# Patient Record
Sex: Male | Born: 1935 | Race: White | Hispanic: No | Marital: Married | Smoking: Never smoker
Health system: Southern US, Community
[De-identification: ages and names within clinical notes are randomized; demographics above are authoritative.]

## PROBLEM LIST (undated history)

## (undated) DIAGNOSIS — C259 Malignant neoplasm of pancreas, unspecified: Secondary | ICD-10-CM

## (undated) DIAGNOSIS — G4733 Obstructive sleep apnea (adult) (pediatric): Secondary | ICD-10-CM

## (undated) DIAGNOSIS — E039 Hypothyroidism, unspecified: Secondary | ICD-10-CM

## (undated) HISTORY — DX: Obstructive sleep apnea (adult) (pediatric): G47.33

## (undated) HISTORY — PX: PROSTATE SURGERY: SHX751

## (undated) HISTORY — DX: Malignant neoplasm of pancreas, unspecified: C25.9

## (undated) HISTORY — DX: Hypothyroidism, unspecified: E03.9

## (undated) HISTORY — PX: APPENDECTOMY: SHX54

---

## 1998-03-19 ENCOUNTER — Other Ambulatory Visit: Admission: RE | Admit: 1998-03-19 | Discharge: 1998-03-19 | Payer: Self-pay | Admitting: Gastroenterology

## 2001-08-03 ENCOUNTER — Ambulatory Visit (HOSPITAL_BASED_OUTPATIENT_CLINIC_OR_DEPARTMENT_OTHER): Admission: RE | Admit: 2001-08-03 | Discharge: 2001-08-03 | Payer: Self-pay | Admitting: Internal Medicine

## 2001-08-03 ENCOUNTER — Encounter: Payer: Self-pay | Admitting: Pulmonary Disease

## 2004-03-28 ENCOUNTER — Ambulatory Visit: Payer: Self-pay | Admitting: Internal Medicine

## 2004-04-07 ENCOUNTER — Ambulatory Visit: Payer: Self-pay | Admitting: Internal Medicine

## 2004-09-15 ENCOUNTER — Ambulatory Visit: Payer: Self-pay | Admitting: Internal Medicine

## 2004-09-19 ENCOUNTER — Ambulatory Visit: Payer: Self-pay | Admitting: Pulmonary Disease

## 2005-02-18 ENCOUNTER — Ambulatory Visit: Payer: Self-pay | Admitting: Internal Medicine

## 2006-02-25 ENCOUNTER — Ambulatory Visit: Payer: Self-pay | Admitting: Internal Medicine

## 2006-02-25 LAB — CONVERTED CEMR LAB: PSA: 6.52 ng/mL

## 2006-04-21 ENCOUNTER — Ambulatory Visit (HOSPITAL_BASED_OUTPATIENT_CLINIC_OR_DEPARTMENT_OTHER): Admission: RE | Admit: 2006-04-21 | Discharge: 2006-04-21 | Payer: Self-pay | Admitting: Urology

## 2006-04-23 ENCOUNTER — Emergency Department (HOSPITAL_COMMUNITY): Admission: EM | Admit: 2006-04-23 | Discharge: 2006-04-24 | Payer: Self-pay | Admitting: Emergency Medicine

## 2006-04-24 ENCOUNTER — Emergency Department (HOSPITAL_COMMUNITY): Admission: EM | Admit: 2006-04-24 | Discharge: 2006-04-24 | Payer: Self-pay | Admitting: Emergency Medicine

## 2006-10-11 ENCOUNTER — Encounter (INDEPENDENT_AMBULATORY_CARE_PROVIDER_SITE_OTHER): Payer: Self-pay | Admitting: Urology

## 2006-10-11 ENCOUNTER — Inpatient Hospital Stay (HOSPITAL_COMMUNITY): Admission: RE | Admit: 2006-10-11 | Discharge: 2006-10-15 | Payer: Self-pay | Admitting: Urology

## 2007-02-03 ENCOUNTER — Encounter: Payer: Self-pay | Admitting: Internal Medicine

## 2007-02-03 DIAGNOSIS — K219 Gastro-esophageal reflux disease without esophagitis: Secondary | ICD-10-CM | POA: Insufficient documentation

## 2007-02-03 DIAGNOSIS — E039 Hypothyroidism, unspecified: Secondary | ICD-10-CM | POA: Insufficient documentation

## 2007-02-03 DIAGNOSIS — Z860100 Personal history of colon polyps, unspecified: Secondary | ICD-10-CM | POA: Insufficient documentation

## 2007-02-03 DIAGNOSIS — N400 Enlarged prostate without lower urinary tract symptoms: Secondary | ICD-10-CM

## 2007-02-03 DIAGNOSIS — E785 Hyperlipidemia, unspecified: Secondary | ICD-10-CM

## 2007-02-03 DIAGNOSIS — G4733 Obstructive sleep apnea (adult) (pediatric): Secondary | ICD-10-CM

## 2007-02-04 ENCOUNTER — Ambulatory Visit: Payer: Self-pay | Admitting: Internal Medicine

## 2007-02-04 LAB — CONVERTED CEMR LAB
ALT: 26 units/L (ref 0–53)
AST: 27 units/L (ref 0–37)
Albumin: 4.1 g/dL (ref 3.5–5.2)
Alkaline Phosphatase: 46 units/L (ref 39–117)
Basophils Absolute: 0 10*3/uL (ref 0.0–0.1)
Bilirubin Urine: NEGATIVE
CO2: 28 meq/L (ref 19–32)
Calcium: 9.4 mg/dL (ref 8.4–10.5)
Chloride: 108 meq/L (ref 96–112)
Cholesterol: 206 mg/dL (ref 0–200)
Creatinine, Ser: 0.8 mg/dL (ref 0.4–1.5)
Eosinophils Relative: 3.5 % (ref 0.0–5.0)
Glucose, Bld: 107 mg/dL — ABNORMAL HIGH (ref 70–99)
HCT: 43.5 % (ref 39.0–52.0)
Hemoglobin, Urine: NEGATIVE
Ketones, ur: NEGATIVE mg/dL
Monocytes Relative: 10 % (ref 3.0–11.0)
Neutrophils Relative %: 51 % (ref 43.0–77.0)
RDW: 13.9 % (ref 11.5–14.6)
Sodium: 142 meq/L (ref 135–145)
Specific Gravity, Urine: 1.025 (ref 1.000–1.03)
Total Bilirubin: 1 mg/dL (ref 0.3–1.2)
Total Protein: 7 g/dL (ref 6.0–8.3)
Urine Glucose: NEGATIVE mg/dL
VLDL: 29 mg/dL (ref 0–40)
pH: 5.5 (ref 5.0–8.0)

## 2007-02-24 ENCOUNTER — Ambulatory Visit: Payer: Self-pay | Admitting: Pulmonary Disease

## 2007-03-31 ENCOUNTER — Ambulatory Visit: Payer: Self-pay | Admitting: Internal Medicine

## 2007-03-31 DIAGNOSIS — M25569 Pain in unspecified knee: Secondary | ICD-10-CM

## 2007-03-31 DIAGNOSIS — E739 Lactose intolerance, unspecified: Secondary | ICD-10-CM | POA: Insufficient documentation

## 2007-03-31 DIAGNOSIS — J309 Allergic rhinitis, unspecified: Secondary | ICD-10-CM | POA: Insufficient documentation

## 2007-08-16 ENCOUNTER — Ambulatory Visit: Payer: Self-pay | Admitting: Internal Medicine

## 2007-12-23 ENCOUNTER — Telehealth: Payer: Self-pay | Admitting: Internal Medicine

## 2008-02-17 ENCOUNTER — Ambulatory Visit: Payer: Self-pay | Admitting: Internal Medicine

## 2008-02-17 LAB — CONVERTED CEMR LAB
ALT: 24 units/L (ref 0–53)
Albumin: 4.2 g/dL (ref 3.5–5.2)
Alkaline Phosphatase: 48 units/L (ref 39–117)
Basophils Relative: 0 % (ref 0.0–3.0)
Cholesterol: 158 mg/dL (ref 0–200)
Creatinine, Ser: 0.8 mg/dL (ref 0.4–1.5)
Eosinophils Absolute: 0.4 10*3/uL (ref 0.0–0.7)
GFR calc Af Amer: 122 mL/min
HCT: 44.9 % (ref 39.0–52.0)
Ketones, ur: NEGATIVE mg/dL
LDL Cholesterol: 88 mg/dL (ref 0–99)
Lymphocytes Relative: 32.4 % (ref 12.0–46.0)
MCV: 93.2 fL (ref 78.0–100.0)
Monocytes Absolute: 0.5 10*3/uL (ref 0.1–1.0)
Monocytes Relative: 8 % (ref 3.0–12.0)
Neutro Abs: 3.6 10*3/uL (ref 1.4–7.7)
Neutrophils Relative %: 54 % (ref 43.0–77.0)
PSA: 0.65 ng/mL (ref 0.10–4.00)
Platelets: 231 10*3/uL (ref 150–400)
Potassium: 4 meq/L (ref 3.5–5.1)
RDW: 12.3 % (ref 11.5–14.6)
Sodium: 140 meq/L (ref 135–145)
Specific Gravity, Urine: 1.025 (ref 1.000–1.03)
TSH: 4.45 microintl units/mL (ref 0.35–5.50)
Total Bilirubin: 1.1 mg/dL (ref 0.3–1.2)
Total CHOL/HDL Ratio: 4.2
Total Protein, Urine: NEGATIVE mg/dL
Triglycerides: 163 mg/dL — ABNORMAL HIGH (ref 0–149)
Urine Glucose: NEGATIVE mg/dL
Urobilinogen, UA: 0.2 (ref 0.0–1.0)

## 2008-08-07 ENCOUNTER — Telehealth (INDEPENDENT_AMBULATORY_CARE_PROVIDER_SITE_OTHER): Payer: Self-pay | Admitting: *Deleted

## 2008-08-09 ENCOUNTER — Ambulatory Visit: Payer: Self-pay | Admitting: Pulmonary Disease

## 2008-08-09 ENCOUNTER — Telehealth: Payer: Self-pay | Admitting: Internal Medicine

## 2008-08-12 ENCOUNTER — Encounter: Payer: Self-pay | Admitting: Internal Medicine

## 2008-08-13 ENCOUNTER — Ambulatory Visit: Payer: Self-pay | Admitting: Internal Medicine

## 2009-05-27 ENCOUNTER — Encounter (INDEPENDENT_AMBULATORY_CARE_PROVIDER_SITE_OTHER): Payer: Self-pay | Admitting: *Deleted

## 2009-06-12 ENCOUNTER — Encounter (INDEPENDENT_AMBULATORY_CARE_PROVIDER_SITE_OTHER): Payer: Self-pay | Admitting: *Deleted

## 2009-06-13 ENCOUNTER — Ambulatory Visit: Payer: Self-pay | Admitting: Gastroenterology

## 2009-06-21 ENCOUNTER — Ambulatory Visit: Payer: Self-pay | Admitting: Gastroenterology

## 2009-11-04 ENCOUNTER — Encounter: Payer: Self-pay | Admitting: Pulmonary Disease

## 2010-06-12 NOTE — Miscellaneous (Signed)
Summary: Moviprep rx  Clinical Lists Changes  Medications: Added new medication of MOVIPREP 100 GM  SOLR (PEG-KCL-NACL-NASULF-NA ASC-C) As per prep instructions. - Signed Rx of MOVIPREP 100 GM  SOLR (PEG-KCL-NACL-NASULF-NA ASC-C) As per prep instructions.;  #1 x 0;  Signed;  Entered by: Jennye Boroughs RN;  Authorized by: Louis Meckel MD;  Method used: [facility] to [facility] [address] [phone]*, [address] [place], [place], [place]  [address], Ph: [phone] or [phone], Fax: [phone]    Prescriptions: MOVIPREP 100 GM  SOLR (PEG-KCL-NACL-NASULF-NA ASC-C) As per prep instructions.  #1 x 0   Entered by:   Jennye Boroughs RN   Authorized by:   Louis Meckel MD   Signed by:   Jennye Boroughs RN on 06/13/2009   Method used:   [facility]        [facility] [address] [phone]* (retail)       [address]       [place], [place]  [address]       Ph: [phone] or [phone]       Fax: [phone]   RxID:   [id number]

## 2010-06-12 NOTE — Letter (Signed)
Summary: Denied CMN/[facility]  Denied CMN/[facility]   Imported By: Lester [place] 11/12/2009 08:25:19  _____________________________________________________________________  External Attachment:    Type:   Image     Comment:   External Document

## 2010-06-12 NOTE — Procedures (Signed)
Summary: Colonoscopy  Patient: [name] Note: All result statuses are Final unless otherwise noted.  Tests: (1) Colonoscopy (COL)   COL Colonoscopy           DONE     [facility]     [address]     [place], [place]  [address]           COLONOSCOPY PROCEDURE REPORT           PATIENT:  Seth King, Seth King  MR#:  [id number]     BIRTHDATE:  [birth year], 77 yrs. old  GENDER:  male           ENDOSCOPIST:  Barbette Hair. Arlyce Dice, MD     Referred by:           PROCEDURE DATE:  06/21/2009     PROCEDURE:  Colonoscopy, Diagnostic     ASA CLASS:  Class II     INDICATIONS:  history of pre-cancerous (adenomatous) colon polyps     , 1999           MEDICATIONS:   Fentanyl 75 mcg IV, Versed 8 mg IV           DESCRIPTION OF PROCEDURE:   After the risks benefits and     alternatives of the procedure were thoroughly explained, informed     consent was obtained.  Digital rectal exam was performed and     revealed no abnormalities.   The LB CF-[id number] endoscope     was introduced through the anus and advanced to the cecum, which     was identified by both the appendix and ileocecal valve, without     limitations.  The quality of the prep was excellent, using     MoviPrep.  The instrument was then slowly withdrawn as the colon     was fully examined.     <<PROCEDUREIMAGES>>           FINDINGS:  Moderate diverticulosis was found in the sigmoid colon     (see image1).  This was otherwise a normal examination of the     colon (see image4, image5, image7, image8, image9, image10,     image13, image14, and image15).   Retroflexed views in the rectum     revealed no abnormalities.    The scope was then withdrawn from     the patient and the procedure completed.           COMPLICATIONS:  None           ENDOSCOPIC IMPRESSION:     1) Moderate diverticulosis in the sigmoid colon     2) Otherwise normal examination     RECOMMENDATIONS:     1) follow-up: office PRN           REPEAT  EXAM:  No           ______________________________     Barbette Hair. Arlyce Dice, MD           CC:  Corwin Levins, MD           n.     Rosalie Doctor:   Barbette Hair. Sharaya Boruff at 06/21/2009 11:24 AM           [place], [place], [phone]  Note: An exclamation mark (!) indicates a result that was not dispersed into the flowsheet. Document Creation Date: 06/21/2009 11:24 AM _______________________________________________________________________  (1) Order result status: Final Collection or observation date-time: 06/21/2009 11:17 Requested date-time:  Receipt date-time:  Reported  date-time:  Referring Physician:   Ordering Physician: Melvia Heaps [phone]) Specimen Source:  Source: Launa Grill Order Number: [id number] Lab site:

## 2010-06-12 NOTE — Letter (Signed)
Summary: Previsit letter  [facility]  [address] [place], [place] [address]   Phone: [phone]  Fax: [phone]       05/27/2009 MRN: [id number]  Seth King [address] [place], [place]  [address]  Dear Seth King,  Welcome to the Gastroenterology Division at [facility].    You are scheduled to see a nurse for your pre-procedure visit on 06-13-09 at 10:30am on the 3rd floor at [facility], [address]  We ask that you try to arrive at our office 15 minutes prior to your appointment time to allow for check-in.  Your nurse visit will consist of discussing your medical and surgical history, your immediate family medical history, and your medications.    Please bring a complete list of all your medications or, if you prefer, bring the medication bottles and we will list them.  We will need to be aware of both prescribed and over the counter drugs.  We will need to know exact dosage information as well.  If you are on blood thinners (Coumadin, Plavix, Aggrenox, Ticlid, etc.) please call our office today/prior to your appointment, as we need to consult with your physician about holding your medication.   Please be prepared to read and sign documents such as consent forms, a financial agreement, and acknowledgement forms.  If necessary, and with your consent, a friend or relative is welcome to sit-in on the nurse visit with you.  Please bring your insurance card so that we may make a copy of it.  If your insurance requires a referral to see a specialist, please bring your referral form from your primary care physician.  No co-pay is required for this nurse visit.     If you cannot keep your appointment, please call [phone] to cancel or reschedule prior to your appointment date.  This allows [place] the opportunity to schedule an appointment for another patient in need of care.    Thank you for choosing [facility] for your medical  needs.  We appreciate the opportunity to care for you.  Please visit [place] at our website  to learn more about our practice.                     Sincerely.                                                                                                                   The Gastroenterology Division

## 2010-06-12 NOTE — Letter (Signed)
Summary: Seth King Instructions  Seth King  [address] [place], [place] [address]   Phone: [phone]  Fax: [phone]       Seth King    03/09/1932    MRN: [id number]        Procedure Day Seth King:  Seth King  06/21/09     Arrival Time:  9:30AM     Procedure Time:  10:30AM     Location of Procedure:                    Seth King _  Seth King (4th Floor)                        PREPARATION FOR COLONOSCOPY WITH MOVIPREP   Starting 5 days prior to your procedure 06/16/09 do not eat nuts, seeds, popcorn, corn, beans, peas,  salads, or any raw vegetables.  Do not take any fiber supplements (e.g. Metamucil, Citrucel, and Benefiber).  THE DAY BEFORE YOUR PROCEDURE         DATE: 06/20/09 DAY: THURSDAY  1.  Drink clear liquids the entire day-NO SOLID FOOD  2.  Do not drink anything colored red or purple.  Avoid juices with pulp.  No orange juice.  3.  Drink at least 64 oz. (8 glasses) of fluid/clear liquids during the day to prevent dehydration and help the prep work efficiently.  CLEAR LIQUIDS INCLUDE: Water Jello Ice Popsicles Tea (sugar ok, no milk/cream) Powdered fruit flavored drinks Coffee (sugar ok, no milk/cream) Gatorade Juice: apple, white grape, white cranberry  Lemonade Clear bullion, consomm, broth Carbonated beverages (any kind) Strained chicken noodle soup Hard Candy                             4.  In the morning, mix first dose of MoviPrep solution:    Empty 1 Pouch A and 1 Pouch B into the disposable container    Add lukewarm drinking water to the top line of the container. Mix to dissolve    Refrigerate (mixed solution should be used within 24 hrs)  5.  Begin drinking the prep at 5:00 p.m. The MoviPrep container is divided by 4 marks.   Every 15 minutes drink the solution down to the next mark (approximately 8 oz) until the full liter is complete.   6.  Follow completed prep with 16 oz of clear liquid of your choice  (Nothing red or purple).  Continue to drink clear liquids until bedtime.  7.  Before going to bed, mix second dose of MoviPrep solution:    Empty 1 Pouch A and 1 Pouch B into the disposable container    Add lukewarm drinking water to the top line of the container. Mix to dissolve    Refrigerate  THE DAY OF YOUR PROCEDURE      DATE: 06/21/09 DAY: FRIDAY  Beginning at 5:30AM (5 hours before procedure):         1. Every 15 minutes, drink the solution down to the next mark (approx 8 oz) until the full liter is complete.  2. Follow completed prep with 16 oz. of clear liquid of your choice.    3. You may drink clear liquids until 8:30AM (2 HOURS BEFORE PROCEDURE).   MEDICATION INSTRUCTIONS  Unless otherwise instructed, you should take regular prescription medications with a small sip of water   as early as possible the morning of your  procedure.           OTHER INSTRUCTIONS  You will need a responsible adult at least 75 years of age to accompany you and drive you home.   This person must remain in the waiting room during your procedure.  Wear loose fitting clothing that is easily removed.  Leave jewelry and other valuables at home.  However, you may wish to bring a book to read or  an iPod/MP3 player to listen to music as you wait for your procedure to start.  Remove all body piercing jewelry and leave at home.  Total time from sign-in until discharge is approximately 2-3 hours.  You should go home directly after your procedure and rest.  You can resume normal activities the  day after your procedure.    The day of your procedure you should not:   Drive   Make legal decisions   Operate machinery   Drink alcohol   Return to work  You will receive specific instructions about eating, activities and medications before you leave.    The above instructions have been reviewed and explained to me by  Seth Boroughs RN  June 13, 2009 10:52 AM     I fully  understand and can verbalize these instructions _____________________________ Date _________

## 2010-09-23 NOTE — Op Note (Signed)
NAME:  Seth King, Seth King NO.:  [id number]   MEDICAL RECORD NO.:  [id number]          PATIENT TYPE:  INP   LOCATION:  1433                         FACILITY[facility]  [facility]   PHYSICIAN:  Mark C. Vernie Ammons, M.D.  DATE OF BIRTH:  October 22, 1931   DATE OF PROCEDURE:  10/11/2006  DATE OF DISCHARGE:                               OPERATIVE REPORT   PREOPERATIVE DIAGNOSIS:  Benign prostatic hypertrophy.   POSTOPERATIVE DIAGNOSES:  1. Benign prostatic hypertrophy.  2. Bladder stone.   PROCEDURES:  1. Open simple suprapubic prostatectomy.  2. Removal of bladder stone.   SURGEON:  Mark C. Vernie Ammons, M.D.   RESIDENT:  Terie Purser, MD.   ANESTHESIA:  General.   COMPLICATIONS:  None.   ESTIMATED BLOOD LOSS:  550 mL.   DRAINS:  22-French Foley catheter, 18-French Foley catheter as a  suprapubic tube, JP drain to bulb suction.   SPECIMENS:  1. Prostate.  2. Bladder stones.  3. Urine culture.   DISPOSITION:  Stable to post anesthesia care unit.   INDICATIONS FOR PROCEDURE:  The patient is a 77 year old male who has a  history of BPH.  He has been on medical therapy, but has failed.  He has  been in urinary retention and has had a suprapubic tube placed.  He has  a large prostate as evidenced on transrectal ultrasound and evidence of  a bladder stone.  He was counseled regarding management options and has  elected to proceed with open simple prostatectomy.  Benefits and risks  procedure were explained, consent was obtained.   DESCRIPTION OF PROCEDURE:  The patient was brought to the operating room  and properly identified.  Time-out was performed to confirm correct  patient and procedure.  He was administered general anesthesia, given  preoperative antibiotic and placed in supine position on the operating  table and prepped and draped in sterile fashion.  We first took a urine  culture from his suprapubic tube.  The suprapubic tube was then removed  and the patient was prepped  and draped.  We then made a lower midline  incision from the level of the umbilicus to the pubic bone.  This  incorporated the suprapubic tube tract.  Dissection was carried down  through the subcutaneous tissues to the fascia.  Fascia was divided in  the midline.  The suprapubic tube tract was excised.  We filled the  bladder to facilitate identification.  The patient did have extensive  adhesions in the retropubic space secondary to multiple suprapubic  tubes.  We carefully dissected these areas off and placed a Bookwalter  retractor.  We then proceeded to incise the bladder in the midline.  Stay sutures of 2-0 Vicryl were placed.  The bladder was opened and  exposed.  The dome was packed superiorly with sponges and held up with a  malleable retractor.  At this point, we were able to visualize the large  median lobe.  The bladder was also quite thick and due to his previous  history.  There were several stones noted in the base of the bladder and  these were  removed.  Indigo carmine was given to facilitate  identification of the ureteral orifices.  Both were noted to be  effluxing clear urine and their location was noted.  We then began by  scoring the mucosa around the bladder neck to gain access to the  prostate.  Gentle blunt dissection was used to develop a plane between  the adenoma and the capsule of the prostate.  Digital dissection was  used to free the posterior lobes down to the apex of the prostate and  then circumferentially sweeping anteriorly.  We were able to remove the  adenoma in one piece.  Once this was performed, the prostatic fossa was  inspected.  Digital sweep was again performed and there was no  significant remaining adenoma.  There was some bleeding coming from the  5 and 7 o'clock positions.  This was controlled using figure-of-eight 2-  0 Vicryl suture after packing the prostatic fossa with sponges.  After  this was performed, the fossa was inspected and  hemostasis was adequate.  At this point, a 22-French Foley catheter was placed through the  urethra.  The balloon was inflated with 60 mL of sterile water.  An 12-  [place] Foley catheter was also placed through a separate stab incision  to the left of the midline.  This was brought into the bladder by  performing a separate cystotomy over a right-angle.  This balloon was  inflated with 5 mL.  The catheter was secured to the bladder using a  pursestring 3-0 chromic suture.  We then proceeded with closure of the  cystotomy.  This was performed in two layers using a 2-0 Vicryl suture  with a watertight mucosal and muscle layer followed by an imbricating  layer.  The bladder was then irrigated through both catheters.  There  was no leak noted.  Both catheters irrigated freely without return of  clot.  It should be noted that prior to closure of the bladder, both  ureteral orifices were also reinspected and both were effluxing clear  blue urine bilaterally.  We then placed a Jackson-Pratt drain through a  separate stab incision in the right lower quadrant.  The drain was  placed in the retropubic space overlying the bladder.  Wound was then  carefully irrigated.  The fascia was closed in a running fashion with #1  PDS.  Again the wound was carefully irrigated and skin was closed with  staples.  Both the suprapubic tube and the Jackson-Pratt drain were  affixed with silk suture at the skin.  A sterile dressing was applied.  The Foley catheter was placed on slight traction and continuous bladder  irrigation was started with return of light pink urine.  The patient was  then awoken from anesthesia and transported to the recovery room in  stable condition.  There were no complications.  All sponge and needle  counts were correct at the end of the case.  Please note that Dr.  Vernie Ammons was present and participated in all aspects of this procedure as  he is primary  Careers adviser.    ______________________________  Terie Purser, MD      Veverly Fells. Vernie Ammons, M.D.  Electronically Signed    JH/MEDQ  D:  10/11/2006  T:  10/12/2006  Job:  [id number]

## 2010-09-23 NOTE — Discharge Summary (Signed)
NAME:  Seth King, Seth King NO.:  [id number]   MEDICAL RECORD NO.:  [id number]          PATIENT TYPE:  INP   LOCATION:  1433                         FACILITY:  [facility]   PHYSICIAN:  Mark C. Vernie Ammons, M.D.  DATE OF BIRTH:  24-Feb-1932   DATE OF ADMISSION:  10/11/2006  DATE OF DISCHARGE:  10/15/2006                               DISCHARGE SUMMARY   ADMISSION DIAGNOSES:  1. Benign prostatic hypertrophy.  2. Bladder outlet obstruction.  3. Urinary retention.   DISCHARGE DIAGNOSES:  1. Benign prostatic hypertrophy.  2. Bladder outlet obstruction.  3. Urinary retention.   PROCEDURE:  Open simple prostatectomy on October 11, 2006.   HISTORY OF PRESENT ILLNESS:  For full details, please see dictated  history and physical.  Briefly, Seth King is a 77 year old patient  who has a longstanding history of BPH with bladder outlet obstruction.  He has been placed on medical therapy but has developed urinary  retention and is currently managed with a suprapubic tube.  He has  failed multiple voiding trials.  Because that he has a significantly  enlarged prostate, he is brought to the hospital for a suprapubic  prostatectomy.   HOSPITAL COURSE:  The patient was brought to the hospital and taken to  the operating room for the procedure described above.  He tolerated this  well and was transferred to the floor in stable condition.  The patient  had continuous bladder irrigation maintained through postoperative day  #2.  His diet was slowly advanced.  He was transitioned from IV to p.o.  pain medicine.  He was ambulating without difficulty.  His suprapubic  tube was removed on postoperative day #3, and his JP drain was removed  on postoperative day #4.  He was therefore deemed stable for discharge  on the morning of postoperative day #4.   PHYSICAL EXAMINATION:  VITAL SIGNS:  At the time of discharge, the  patient was afebrile with stable vital signs.  GENERAL:  He was in no acute  distress.  HEART:  Regular.  LUNGS:  Clear.  ABDOMEN:  Soft, nontender.  Surgical incision was clean, dry, intact  with staples in place.  He had a Foley catheter in place draining clear  urine.   DISPOSITION AND DISCHARGE INSTRUCTIONS:  The patient is discharged to  home in stable and improved condition.  He is to resume his previous  diet.  Activity level is restricted to no heavy lifting.  He is  instructed not to drive a car or do any strenuous exercise.  He was also  instructed to call with any questions or concerns regarding care,  specifically any fevers greater than 100.5, any increasing abdominal  pain, nausea, vomiting or increasing redness or drainage from his  incision, and also any difficulty with his Foley catheter.   DISCHARGE MEDICATIONS:  1. Synthroid 88 mcg daily.  2. Lovastatin 20 mg daily.  3. Multivitamin daily.  4. Citrucel daily.  5. The patient should stop his Flomax and Avodart.  6. He was also given a prescription for Vicodin as needed for pain and  Cipro for recent urinary tract infection.   FOLLOW UP:  The patient will follow up with Dr. Vernie Ammons in approximately  1 week for catheter removal.     ______________________________  Terie Purser, MD      Veverly Fells. Vernie Ammons, M.D.  Electronically Signed    JH/MEDQ  D:  10/15/2006  T:  10/15/2006  Job:  [id number]

## 2010-09-23 NOTE — H&P (Signed)
NAME:  Seth King, Seth King NO.:  [id number]   MEDICAL RECORD NO.:  [id number]          PATIENT TYPE:  INP   LOCATION:  0006                         FACILITY:  [facility]   PHYSICIAN:  Mark C. Vernie Ammons, M.D.  DATE OF BIRTH:  [birth year]   DATE OF ADMISSION:  10/11/2006  DATE OF DISCHARGE:                              HISTORY & PHYSICAL   HISTORY:  Seth King is a 77 year old white male patient that I have  seen for a long time with BPH and bladder outlet obstructive symptoms.  He has had a prior transrectal ultrasound and biopsy of his prostate  because of an elevated PSA back in December 2003.  At that time his  prostate was 126 mL.  He had been doing well as far as voiding goes with  alpha blockade therapy; however, developed urinary retention.  A  suprapubic tube was placed and he was placed on maximum medical  management using both Avodart and Flomax.  Despite this, he continued to  have urinary retention with several voiding trials attempted and failed.  We discussed all the treatment options and he has elected to proceed  with suprapubic prostatectomy.  He is brought to the hospital today for  this elective procedure.   PAST MEDICAL HISTORY:  Positive for hypothyroidism, sleep apnea and  elevated cholesterol.   SURGICAL HISTORY:  He has had an appendectomy.   MEDICATIONS:  1. Levothyroxine 88 mcg.  2. Flomax 0.4 mg.  3. Lovastatin 20 mg.  4. Avodart 0.5 mg.  5. Multivitamin.  6. Citrucel.   ALLERGIES:  PENICILLIN caused hives.   SOCIAL HISTORY:  He is married, retired, does drink alcohol two to three  times a week, and denies tobacco use.   FAMILY HISTORY:  Mother died at 18, father at 32 years of age, and there  is diabetes in the family.   REVIEW OF SYSTEMS:  Reveals occasional fatigue and sinus problems,  dizziness, otherwise is completely negative.   PHYSICAL EXAMINATION:  VITAL SIGNS:  Temperature 96.6, pulse 64,  respiration 18, blood pressure  153/77.  GENERAL:  The patient is a well-developed, well-nourished white male in  no apparent distress.  HEENT:  Atraumatic, normocephalic.  Oropharynx is clear.  NECK:  Supple with midline trachea.  CHEST:  Reveals normal respiratory effort.  CARDIOVASCULAR:  Regular rate and rhythm.  ABDOMEN:  Obese, soft and nontender without mass or HSM.  He has a  suprapubic tube exiting the suprapubic region draining clear urine with  no sign of site infection.  GENITOURINARY:  He has normal phallus, glans, meatus, scrotum,  testicles, epididymis, anus and perineum.  Normal rectal tone.  His  prostate is 3+ enlarged, smooth, symmetric and has no suspicious  nodularity or induration.  No seminal vesicle abnormalities noted.  SKIN:  Warm and dry.  EXTREMITIES:  Without clubbing, cyanosis, or edema.  NEUROLOGIC:  He is alert and oriented with appropriate mood and affect,  has no gross focal neurologic deficit.   LABORATORY RESULTS:  His PSA in August 2007 was 7.0 but the ratio was  30.7%.  His PSA has  been as high as 17 back in June 2006, indicating  likely prostatitis.  When he was biopsied in 200e his PSA was 7.32.  A  urine culture was done on Oct 05, 2006, and was positive for what  appeared to be Pseudomonas.  It was sensitive to the fluoroquinolones as  well as ceftriaxone.  He has been on Cipro 500 mg b.i.d. since May 29.  His white count is 7.9; hemoglobin 15.8; hematocrit 45.0; platelets  228,000.  Electrolytes are normal with a creatinine of 0.82.  Liver  function tests are normal.  PT 13.2, INR 1.0, PTT 27.   Chest x-ray revealed no evidence of infiltrate, congestive failure or  pneumothorax.  There are slight increased markings at the costophrenic  angle, possibly representing scarring or atelectasis.   EKG reveals normal sinus rhythm with some sinus arrhythmia, no ST- or T-  wave changes noted.   IMPRESSION:  1. Benign prostatic hypertrophy with bladder outlet obstruction and       resultant urinary retention.  I have attempted conservative medical      management for several months now without success.  The patient is      therefore brought to the operating room for surgical treatment.  I      have gone over the procedure with him as well as its risks and      complications and alternatives.  2. He has history of prostatitis.  3. History of elevated PSA with negative biopsy in December 2003.  4. Recent urinary tract infection.  He is currently on antibiotics for      that.  5. Sleep apnea.  He uses a CPAP mask at night with a setting of 10.  6. Hypothyroidism.  7. Hypercholesterolemia.   PLAN:  1. He has been on oral Cipro.  I am also going to give him 1 g of      Rocephin preoperatively.  2. Routine DVT prophylaxis with PAS and TED hose.  This type of      surgery has a high probability of postoperative bleeding      complications.  He therefore will not be placed on heparin or      Lovenox postoperatively.  3. He will use his CPAP mask and I will monitor his oxygen saturation      through his hospitalization.  4. Proceed with the suprapubic prostatectomy under general anesthesia.      Mark C. Vernie Ammons, M.D.  Electronically Signed     MCO/MEDQ  D:  10/11/2006  T:  10/11/2006  Job:  [id number]

## 2010-09-26 NOTE — Op Note (Signed)
NAME:  Seth King, Seth King NO.:  [id number]   MEDICAL RECORD NO.:  [id number]          PATIENT TYPE:  AMB   LOCATION:  NESC                         FACILITY:  [facility]   PHYSICIAN:  Mark C. Vernie Ammons, M.D.  DATE OF BIRTH:  1932-02-22   DATE OF PROCEDURE:  DATE OF DISCHARGE:                               OPERATIVE REPORT   PREOPERATIVE DIAGNOSIS:  Urinary retention.   POSTOPERATIVE DIAGNOSIS:  Urinary retention.   PROCEDURE:  Cystoscopy with punch suprapubic tube placement.   SURGEON:  Dr. Vernie Ammons   ANESTHESIA:  General with local supplement.   SPECIMENS:  None.   BLOOD LOSS:  Minimal.   DRAINS:  14 French Fader tip suprapubic tube.   COMPLICATIONS:  None.   INDICATIONS:  The patient is a 77 year old white male with a markedly  enlarged prostate who has had outlet obstructive symptoms for some time.  He was managed with Flomax and by previous ultrasound in 2003, was found  to have a 126 g prostate.  He went into urinary retention and has been  on Flomax and Avodart without improvement of his voiding.  He continues  to fail voiding trials, and we have discussed surgery which would  require suprapubic prostatectomy based on his previous prostate size.  My hope was that I could shrink his prostate and hopefully get him  voiding spontaneously but it on Avodart; however, if he does not  eventually void spontaneously, then if his prostate has decreased in  size significantly, consideration for TURP could be given.   DESCRIPTION OF OPERATION:  After informed consent, the patient brought  to major OR, placed on table, administered general anesthesia.  He had  been placed on Cipro 500 mg b.i.d. for 4 days prior to the procedure.  He also received intravenous Cipro 400 mg in the holding area.  He was  administered general LMA anesthesia and then moved to the dorsal  lithotomy position.  His genitalia was sterilely prepped and draped.  A  22-French cystoscope with  12-degree lens was inserted.  The urethra is  noted to be normal down to the sphincter which appears intact.  He has  trilobar hypertrophy with some irritation of the prostatic urethra from  the Foley catheter but no worrisome lesions.  The bladder was then  entered and noted to have 2+ trabeculation.  There were no tumor,  stones, or worrisome lesions.  There was some irritation on the  posterior bladder wall from the Foley catheter.   The patient was placed in Trendelenburg position.  The bladder was then  filled through the cystoscope with 70-degree lens in place and  approximately 2-3 fingerbreadths above the symphysis pubis.  Marcaine  0.5% with epinephrine was then injected into the skin and subcu tissue.  I then used a spinal needle to find the bladder which was seen to enter  the bladder at the dome cystoscopically.  I then removed this and made a  small stab incision and passed the Fader tip suprapubic tube through the  stab incision into the bladder at its dome.  The trocar was removed from  the suprapubic tube, and curl was applied to the tip.  It was also  secured to the skin with a single 2-0 silk suture.  This was connected  to closed system drainage; the cystoscope was removed, and the patient  was awakened and taken to recovery room in stable satisfactory  condition.  Tolerated procedure well.  There were no intraoperative  complications.  I did perform DRE and found the prostate to be smooth  and symmetric but 2-3+ enlarged.   He was given written instructions on suprapubic care and how to perform  voiding trials with the suprapubic tube.  He will also remain on Cipro  500 mg b.i.d. for another 5 days, and I gave him 24 Vicodin for pain.  He will follow up in my office in 4 weeks for suprapubic tube change at  that time.      Mark C. Vernie Ammons, M.D.  Electronically Signed     MCO/MEDQ  D:  04/21/2006  T:  04/21/2006  Job:  [id number]

## 2011-02-26 LAB — URINE CULTURE
Colony Count: 60000
Special Requests: NEGATIVE

## 2011-02-26 LAB — CBC
HCT: 38.6 — ABNORMAL LOW
Hemoglobin: 13.3
MCHC: 34.5
MCV: 89.7
Platelets: 226
RBC: 4.3
RDW: 14.3 — ABNORMAL HIGH
WBC: 11.6 — ABNORMAL HIGH

## 2011-02-26 LAB — ABO/RH: ABO/RH(D): A POS

## 2011-02-26 LAB — BASIC METABOLIC PANEL
BUN: 8
CO2: 24
Calcium: 8.2 — ABNORMAL LOW
Chloride: 104
Creatinine, Ser: 0.82
GFR calc Af Amer: >60
GFR calc non Af Amer: >60
Glucose, Bld: 128 — ABNORMAL HIGH
Potassium: 4.2
Sodium: 136

## 2011-02-26 LAB — TYPE AND SCREEN
ABO/RH(D): A POS
Antibody Screen: NEGATIVE

## 2011-02-26 LAB — HEMOGLOBIN AND HEMATOCRIT, BLOOD
HCT: 41.9
Hemoglobin: 14.3

## 2013-08-07 ENCOUNTER — Telehealth: Payer: Self-pay | Admitting: Pulmonary Disease

## 2013-08-07 ENCOUNTER — Ambulatory Visit (INDEPENDENT_AMBULATORY_CARE_PROVIDER_SITE_OTHER): Payer: Medicare Other | Admitting: Pulmonary Disease

## 2013-08-07 ENCOUNTER — Encounter: Payer: Self-pay | Admitting: *Deleted

## 2013-08-07 VITALS — BP 122/76 | HR 74 | Temp 97.7°F | Ht 71.0 in | Wt 198.8 lb

## 2013-08-07 DIAGNOSIS — G4733 Obstructive sleep apnea (adult) (pediatric): Secondary | ICD-10-CM

## 2013-08-07 NOTE — Assessment & Plan Note (Addendum)
We will set you up with CPAP  Your machine will be set at 10 cm God luck with surgery  Take your machine with you & let your surgeon/ anesthesiologist know He has lost weight since his original study, and we will perhaps repeat a polysomnogram in the future once his acute issues are resolved   compliance with goal of at least 4-6 hrs every night is the expectation. Advised against medications with sedative side effects Cautioned against driving when sleepy - understanding that sleepiness will vary on a day to day basis

## 2013-08-07 NOTE — Patient Instructions (Signed)
We will set you up with CPAP  Your machine will be set at 10 cm God luck with surgery  Take your machine with you & let your surgeon/ anesthesiologist know

## 2013-08-07 NOTE — Progress Notes (Signed)
   Subjective:    Patient ID: Seth King, male    DOB: 05/14/1931, 80 y.o.   MRN: [id number]  HPI  80 year old man presents for management of obstructive sleep apnea. Polysomnogram in 07/2001 - weight 215 pounds-showed severe OSA with RDI of 76 events per hour corrected by CPAP of 10 cm. He changed his CPAP in 2010, and was very compliant with this. His machine however broke down a few weeks ago when he was in [place]. 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 Unfortunately he has been diagnosed with what seems like: cholangio-carcinoma, and whipple'ssurgery is planned at [facility].  Epworth sleepiness score is 4/24. Bedtime is around 10:30 PM, sleep latency is about 20 minutes, he sleeps on his side with one pillow, has 2-3 awakenings for nocturia and is out of bed at 7 AM without headache or fatigue. He does report mild dryness of mouth. He is lost about 15 pounds since the sleep study. He is very compliant with his CPAP and denies mask or pressure issues.   Past Medical History  Diagnosis Date  . OSA (obstructive sleep apnea)   . Hypothyroidism   . Pancreatic cancer     Past Surgical History  Procedure Laterality Date  . Appendectomy    . Prostate surgery      Allergies  Allergen Reactions  . Penicillins     History   Social History  . Marital Status: Married    Spouse Name: N/A    Number of Children: N/A  . Years of Education: N/A   Occupational History  . retired    Social History Main Topics  . Smoking status: Never Smoker   . Smokeless tobacco: Not on file  . Alcohol Use: No  . Drug Use: No  . Sexual Activity: Not on file   Other Topics Concern  . Not on file   Social History Narrative  . No narrative on file     Review of Systems  Constitutional: Positive for unexpected weight change. Negative for fever.  HENT: Negative for congestion, dental problem, ear pain, nosebleeds, postnasal drip, rhinorrhea, sinus pressure,  sneezing, sore throat and trouble swallowing.   Eyes: Negative for redness and itching.  Respiratory: Positive for shortness of breath. Negative for cough, chest tightness and wheezing.   Cardiovascular: Negative for palpitations and leg swelling.  Gastrointestinal: Negative for nausea and vomiting.  Genitourinary: Negative for dysuria.  Musculoskeletal: Negative for joint swelling.  Skin: Negative for rash.  Neurological: Negative for headaches.  Hematological: Does not bruise/bleed easily.  Psychiatric/Behavioral: Negative for dysphoric mood. The patient is not nervous/anxious.        Objective:   Physical Exam  Gen. Pleasant, well-nourished, in no distress, normal affect ENT - no lesions, no post nasal drip, Class II airway Neck: No JVD, no thyromegaly, no carotid bruits Lungs: no use of accessory muscles, no dullness to percussion, clear without rales or rhonchi  Cardiovascular: Rhythm regular, heart sounds  normal, no murmurs or gallops, no peripheral edema Abdomen: soft and non-tender, no hepatosplenomegaly, BS normal. Musculoskeletal: No deformities, no cyanosis or clubbing Neuro:  alert, non focal        Assessment & Plan:

## 2013-08-07 NOTE — Telephone Encounter (Signed)
I spoke with the pt and he is in need of a new cpap machine. He was last seen 08-2008, so I advised that he will need to be seen. An appt was set for this afternoon at 4pm with RA. Elmwood Bing, CMA

## 2013-08-07 NOTE — Telephone Encounter (Signed)
This pt has already been taken care of per [facility]. Will close message.

## 2013-08-10 ENCOUNTER — Emergency Department (HOSPITAL_COMMUNITY): Payer: Medicare Other

## 2013-08-10 ENCOUNTER — Encounter (HOSPITAL_COMMUNITY): Payer: Self-pay | Admitting: Emergency Medicine

## 2013-08-10 ENCOUNTER — Emergency Department (HOSPITAL_COMMUNITY)
Admission: EM | Admit: 2013-08-10 | Discharge: 2013-08-10 | Disposition: A | Discharge status: Home / Self Care | Payer: Medicare Other | Attending: Emergency Medicine | Admitting: Emergency Medicine

## 2013-08-10 DIAGNOSIS — Z880 Allergy status to penicillin: Secondary | ICD-10-CM | POA: Insufficient documentation

## 2013-08-10 DIAGNOSIS — C259 Malignant neoplasm of pancreas, unspecified: Secondary | ICD-10-CM | POA: Insufficient documentation

## 2013-08-10 DIAGNOSIS — Z79899 Other long term (current) drug therapy: Secondary | ICD-10-CM | POA: Insufficient documentation

## 2013-08-10 DIAGNOSIS — R17 Unspecified jaundice: Secondary | ICD-10-CM | POA: Insufficient documentation

## 2013-08-10 DIAGNOSIS — Z8669 Personal history of other diseases of the nervous system and sense organs: Secondary | ICD-10-CM | POA: Insufficient documentation

## 2013-08-10 DIAGNOSIS — E039 Hypothyroidism, unspecified: Secondary | ICD-10-CM | POA: Insufficient documentation

## 2013-08-10 DIAGNOSIS — R109 Unspecified abdominal pain: Secondary | ICD-10-CM | POA: Insufficient documentation

## 2013-08-10 LAB — CBC WITH DIFFERENTIAL/PLATELET
BASOS PCT: 1 % (ref 0–1)
Basophils Absolute: 0 10*3/uL (ref 0.0–0.1)
EOS ABS: 0.2 10*3/uL (ref 0.0–0.7)
EOS PCT: 3 % (ref 0–5)
HEMATOCRIT: 38.1 % — AB (ref 39.0–52.0)
Hemoglobin: 13.7 g/dL (ref 13.0–17.0)
LYMPHS ABS: 1.9 10*3/uL (ref 0.7–4.0)
LYMPHS PCT: 26 % (ref 12–46)
MCH: 31.8 pg (ref 26.0–34.0)
MCHC: 36 g/dL (ref 30.0–36.0)
MCV: 88.4 fL (ref 78.0–100.0)
MONO ABS: 1.6 10*3/uL — AB (ref 0.1–1.0)
MONOS PCT: 22 % — AB (ref 3–12)
NEUTROS PCT: 50 % (ref 43–77)
Neutro Abs: 3.7 10*3/uL (ref 1.7–7.7)
PLATELETS: 283 10*3/uL (ref 150–400)
RBC: 4.31 MIL/uL (ref 4.22–5.81)
RDW: 13.9 % (ref 11.5–15.5)
WBC: 7.5 10*3/uL (ref 4.0–10.5)

## 2013-08-10 LAB — HEPATIC FUNCTION PANEL
ALBUMIN: 2.4 g/dL — AB (ref 3.5–5.2)
ALT: 93 U/L — ABNORMAL HIGH (ref 0–53)
AST: 102 U/L — AB (ref 0–37)
Alkaline Phosphatase: 149 U/L — ABNORMAL HIGH (ref 39–117)
BILIRUBIN DIRECT: 1.8 mg/dL — AB (ref 0.0–0.3)
BILIRUBIN INDIRECT: 1.9 mg/dL — AB (ref 0.3–0.9)
TOTAL PROTEIN: 7.2 g/dL (ref 6.0–8.3)
Total Bilirubin: 3.7 mg/dL — ABNORMAL HIGH (ref 0.3–1.2)

## 2013-08-10 LAB — BASIC METABOLIC PANEL
BUN: 8 mg/dL (ref 6–23)
CHLORIDE: 95 meq/L — AB (ref 96–112)
CO2: 23 meq/L (ref 19–32)
Calcium: 9.1 mg/dL (ref 8.4–10.5)
Creatinine, Ser: 0.58 mg/dL (ref 0.50–1.35)
GFR calc Af Amer: >90 mL/min (ref >90)
GFR calc non Af Amer: >90 mL/min (ref >90)
GLUCOSE: 136 mg/dL — AB (ref 70–99)
Potassium: 4.2 mEq/L (ref 3.7–5.3)
Sodium: 131 mEq/L — ABNORMAL LOW (ref 137–147)

## 2013-08-10 LAB — URINALYSIS, ROUTINE W REFLEX MICROSCOPIC
Glucose, UA: NEGATIVE mg/dL
Hgb urine dipstick: NEGATIVE
Ketones, ur: NEGATIVE mg/dL
LEUKOCYTES UA: NEGATIVE
Nitrite: NEGATIVE
PH: 6 (ref 5.0–8.0)
Protein, ur: NEGATIVE mg/dL
Specific Gravity, Urine: 1.013 (ref 1.005–1.030)
UROBILINOGEN UA: 4 mg/dL — AB (ref 0.0–1.0)

## 2013-08-10 LAB — LIPASE, BLOOD: LIPASE: 155 U/L — AB (ref 11–59)

## 2013-08-10 MED ORDER — DEXTROSE 5 % IV SOLN
1.0000 g | INTRAVENOUS | Status: DC
  Administered 2013-08-10: 1 g via INTRAVENOUS
  Filled 2013-08-10: qty 10

## 2013-08-10 NOTE — ED Notes (Signed)
Pt w/ hx of pancreatic CA - has had stent placed to common bile duct and is scheduled to have a Whipple Procedure - per pt's wife, pt noted to have chills at home this evening, unsure of temperature d/t pt's does not currently have a thermometer. Pt pleasant, A&Ox4, no acute distress, denies pain, n/v/d or any other complaints at present.

## 2013-08-10 NOTE — Discharge Instructions (Signed)
Jaundice  Jaundice is when the skin, whites of the eyes, and mucous membranes turn a yellowish color. It is caused by high levels of bilirubin in the blood. Bilirubin is produced by the normal breakdown of red blood cells. Jaundice may mean the liver or bile system in your body is not working right. HOME CARE  Rest.  Drink enough fluids to keep your pee (urine) clear or pale yellow.  Do not drink alcohol.  Only take medicine as told by your doctor.  If you have jaundice because of viral hepatitis or an infection:  Avoid close contact with people.  Avoid making food for others.  Avoid sharing eating utensils with others.  Wash your hands often.  Keep all follow-up visits with your doctor.  Use skin lotion to help with itching. GET HELP RIGHT AWAY IF:  You have more pain.  You keep throwing up (vomiting).  You lose too much body fluid (dehydration).  You have a fever or persistent symptoms for more than 72 hours.  You have a fever and your symptoms suddenly get worse.  You become weak or confused.  You develop a severe headache. MAKE SURE YOU:  Understand these instructions.  Will watch your condition.  Will get help right away if you are not doing well or get worse. Document Released: 05/30/2010 Document Revised: 07/20/2011 Document Reviewed: 05/30/2010 [facility] Patient Information 2014 [place], [place].

## 2013-08-10 NOTE — ED Provider Notes (Signed)
CSN: [id number]     Arrival date & time 08/10/13  2045 History   First MD Initiated Contact with Patient 08/10/13 2104     Chief Complaint  Patient presents with  . Chills   HPI Patient was recently diagnosed with pancreatic cancer while down in [place]. The patient did have a biliary stent placed. Patient returned to [facility] and had consultation with surgeons at [facility]. Patient is scheduled to have a Whipple procedure performed later this month. The patient had a recent evaluation at [facility] including laboratory tests and CT imaging. The patient had an episode of chills today. They do not have a thermometer so they're not sure if he had a fever. Patient has not had any trouble with vomiting or diarrhea. He continues to have some mild abdominal discomfort but has not noticed any significant changes. Patient called the doctor on call at [facility] and was told to come to the emergency room to make sure that his stent was not blocked. Past Medical History  Diagnosis Date  . OSA (obstructive sleep apnea)   . Hypothyroidism   . Pancreatic cancer    Past Surgical History  Procedure Laterality Date  . Appendectomy    . Prostate surgery     Family History  Problem Relation Age of Onset  . Diabetes    . Cancer Maternal Grandmother   . Sleep apnea Son    History  Substance Use Topics  . Smoking status: Never Smoker   . Smokeless tobacco: Not on file  . Alcohol Use: Yes     Comment: occ    Review of Systems  All other systems reviewed and are negative.      Allergies  Penicillins  Home Medications   Current Outpatient Rx  Name  Route  Sig  Dispense  Refill  . levothyroxine (SYNTHROID, LEVOTHROID) 150 MCG tablet   Oral   Take 150 mcg by mouth daily before breakfast.          BP 128/61  Pulse 93  Temp(Src) 98.3 F (36.8 C) (Oral)  Resp 18  SpO2 96% Physical Exam  Nursing note and vitals reviewed. Constitutional: He appears well-developed and well-nourished. No distress.   HENT:  Head: Normocephalic and atraumatic.  Right Ear: External ear normal.  Left Ear: External ear normal.  Eyes: Conjunctivae are normal. Right eye exhibits no discharge. Left eye exhibits no discharge. No scleral icterus.  Neck: Neck supple. No tracheal deviation present.  Cardiovascular: Normal rate, regular rhythm and intact distal pulses.   Pulmonary/Chest: Effort normal and breath sounds normal. No stridor. No respiratory distress. He has no wheezes. He has no rales.  Abdominal: Soft. Bowel sounds are normal. He exhibits no distension. There is no tenderness. There is no rebound and no guarding.  Musculoskeletal: He exhibits no edema and no tenderness.  Neurological: He is alert. He has normal strength. No cranial nerve deficit (no facial droop, extraocular movements intact, no slurred speech) or sensory deficit. He exhibits normal muscle tone. He displays no seizure activity. Coordination normal.  Skin: Skin is warm and dry. No rash noted.  Psychiatric: He has a normal mood and affect.    ED Course  Procedures (including critical care time) Labs Review Labs Reviewed  CBC WITH DIFFERENTIAL - Abnormal; Notable for the following:    HCT 38.1 (*)    Monocytes Relative 22 (*)    Monocytes Absolute 1.6 (*)    All other components within normal limits  LIPASE, BLOOD - Abnormal;  Notable for the following:    Lipase 155 (*)    All other components within normal limits  HEPATIC FUNCTION PANEL - Abnormal; Notable for the following:    Albumin 2.4 (*)    AST 102 (*)    ALT 93 (*)    Alkaline Phosphatase 149 (*)    Total Bilirubin 3.7 (*)    Bilirubin, Direct 1.8 (*)    Indirect Bilirubin 1.9 (*)    All other components within normal limits  BASIC METABOLIC PANEL - Abnormal; Notable for the following:    Sodium 131 (*)    Chloride 95 (*)    Glucose, Bld 136 (*)    All other components within normal limits  URINALYSIS, ROUTINE W REFLEX MICROSCOPIC - Abnormal; Notable for the  following:    Color, Urine AMBER (*)    Bilirubin Urine SMALL (*)    Urobilinogen, UA 4.0 (*)    All other components within normal limits   Imaging Review Dg Chest 2 View  08/10/2013   CLINICAL DATA:  Fever and chills.  EXAM: CHEST  2 VIEW  COMPARISON:  04/21/2006  FINDINGS: The heart size and pulmonary vascularity are normal. There is minimal scarring at the left base laterally and there is a tiny calcified granuloma in the right midzone laterally. There are tortuous brachiocephalic vessels in the superior mediastinum on the right, unchanged. No acute osseous abnormality.  IMPRESSION: No acute disease.   Electronically Signed   By: Rozetta Nunnery M.D.   On: 08/10/2013 21:55    On March 31st WBC was 7.5 and Bili was 4.5 at [facility].  Discussed with Dr Mariah Milling at [facility].  Requests labs.  Concerned that pt could be developing cholangitis with his symptoms of chills.  Empiric rocephin given while waiting for lab results. MDM   Final diagnoses:  Hyperbilirubinemia  Pancreatic cancer    Pt's bilirubin is decreasing.  Stent seems to functioning properly.  WBC normal, afebrile.  Doubt cholangitis.  Pt is stable for discharge.   Kathalene Frames, MD 08/10/13 [phone]

## 2013-08-10 NOTE — ED Notes (Signed)
Pt ambulating independently w/ steady gait on d/c in no acute distress, A&Ox4.D/c instructions reviewed w/ pt and family - pt and family deny any further questions or concerns at present.  

## 2013-08-10 NOTE — ED Notes (Signed)
Wife states pt is scheduled to have a whipple procedure April 29th at [facility]  Wife states earlier today pt had a bout of chills and shaking  Pts wife called the on call dr and he states pt needs to come in and be checked to see if his stent is blocked at the common bile duct

## 2013-08-15 HISTORY — PX: ERCP W/ PLASTIC STENT PLACEMENT: SHX1522

## 2013-08-24 ENCOUNTER — Telehealth: Payer: Self-pay | Admitting: Pulmonary Disease

## 2013-08-24 NOTE — Telephone Encounter (Signed)
Will leave message open and await [facility] call back.

## 2013-08-24 NOTE — Telephone Encounter (Signed)
Spoke with patient wife--states that order was placed at last OV/Consult 08/07/13 for NEW DME and NEW CPAP. Pt states that she has not heard anything back in regards to getting a new CPAP. Pt states that she has been in and out of the house d/t her husband still being in [facility] (inpatient) I explained to the pt wife that I can see the order that was placed 08/07/2013 to [facility] for the new CPAP-- told wife Seth King) that I would contact [place] to find out what is going on. Expressed understanding and will await a call back from our office. ------------------- Spoke with Seth King at [facility] about the order that was placed 3/30. Seth King states that the patient is not in their system and that they do not have any order for this patient. Seth King is going to look into this and contact our office back once he is able to do more research on why this patient is not showing up in the system.  [facility] to contact our office back once this is researched.  Will send to Dr Elsworth Soho nurse as Juluis Rainier / follow up on.

## 2013-08-25 NOTE — Telephone Encounter (Signed)
Spoke with Melissa. She reports they never received order and they pulled this yesterday. Once pt gets home from [facility] they are going to arrange set up at home.  I called spoke with Manuela Schwartz. She reports they did call her and will call once pt is released. Nothing further needed

## 2013-08-29 ENCOUNTER — Institutional Professional Consult (permissible substitution): Payer: Self-pay | Admitting: Pulmonary Disease

## 2013-10-04 ENCOUNTER — Emergency Department (HOSPITAL_COMMUNITY): Payer: Medicare Other

## 2013-10-04 ENCOUNTER — Inpatient Hospital Stay (HOSPITAL_COMMUNITY)
Admission: EM | Admit: 2013-10-04 | Discharge: 2013-10-06 | DRG: 864 | Disposition: A | Discharge status: Hospice | Payer: Medicare Other | Attending: Internal Medicine | Admitting: Internal Medicine

## 2013-10-04 ENCOUNTER — Encounter (HOSPITAL_COMMUNITY): Payer: Self-pay | Admitting: Emergency Medicine

## 2013-10-04 DIAGNOSIS — C259 Malignant neoplasm of pancreas, unspecified: Secondary | ICD-10-CM | POA: Diagnosis present

## 2013-10-04 DIAGNOSIS — Z6825 Body mass index (BMI) 25.0-25.9, adult: Secondary | ICD-10-CM

## 2013-10-04 DIAGNOSIS — R509 Fever, unspecified: Secondary | ICD-10-CM | POA: Diagnosis present

## 2013-10-04 DIAGNOSIS — Z66 Do not resuscitate: Secondary | ICD-10-CM | POA: Diagnosis present

## 2013-10-04 DIAGNOSIS — Z9889 Other specified postprocedural states: Secondary | ICD-10-CM

## 2013-10-04 DIAGNOSIS — C787 Secondary malignant neoplasm of liver and intrahepatic bile duct: Secondary | ICD-10-CM | POA: Diagnosis present

## 2013-10-04 DIAGNOSIS — E039 Hypothyroidism, unspecified: Secondary | ICD-10-CM | POA: Diagnosis present

## 2013-10-04 DIAGNOSIS — R7301 Impaired fasting glucose: Secondary | ICD-10-CM | POA: Diagnosis present

## 2013-10-04 DIAGNOSIS — G4733 Obstructive sleep apnea (adult) (pediatric): Secondary | ICD-10-CM | POA: Diagnosis present

## 2013-10-04 DIAGNOSIS — E739 Lactose intolerance, unspecified: Secondary | ICD-10-CM | POA: Diagnosis present

## 2013-10-04 DIAGNOSIS — E669 Obesity, unspecified: Secondary | ICD-10-CM | POA: Diagnosis present

## 2013-10-04 DIAGNOSIS — Z880 Allergy status to penicillin: Secondary | ICD-10-CM

## 2013-10-04 DIAGNOSIS — Z515 Encounter for palliative care: Secondary | ICD-10-CM

## 2013-10-04 DIAGNOSIS — R5383 Other fatigue: Secondary | ICD-10-CM

## 2013-10-04 DIAGNOSIS — E43 Unspecified severe protein-calorie malnutrition: Secondary | ICD-10-CM | POA: Diagnosis present

## 2013-10-04 DIAGNOSIS — Z79899 Other long term (current) drug therapy: Secondary | ICD-10-CM

## 2013-10-04 DIAGNOSIS — R5381 Other malaise: Secondary | ICD-10-CM | POA: Diagnosis present

## 2013-10-04 LAB — URINALYSIS, ROUTINE W REFLEX MICROSCOPIC
Glucose, UA: NEGATIVE mg/dL
Hgb urine dipstick: NEGATIVE
Ketones, ur: NEGATIVE mg/dL
NITRITE: NEGATIVE
PH: 7.5 (ref 5.0–8.0)
Protein, ur: NEGATIVE mg/dL
Specific Gravity, Urine: 1.02 (ref 1.005–1.030)
UROBILINOGEN UA: 0.2 mg/dL (ref 0.0–1.0)

## 2013-10-04 LAB — I-STAT TROPONIN, ED: Troponin i, poc: 0.02 ng/mL (ref 0.00–0.08)

## 2013-10-04 LAB — COMPREHENSIVE METABOLIC PANEL
ALT: 19 U/L (ref 0–53)
AST: 33 U/L (ref 0–37)
Albumin: 3 g/dL — ABNORMAL LOW (ref 3.5–5.2)
Alkaline Phosphatase: 70 U/L (ref 39–117)
BILIRUBIN TOTAL: 0.8 mg/dL (ref 0.3–1.2)
BUN: 5 mg/dL — AB (ref 6–23)
CALCIUM: 9 mg/dL (ref 8.4–10.5)
CHLORIDE: 95 meq/L — AB (ref 96–112)
CO2: 26 mEq/L (ref 19–32)
CREATININE: 0.37 mg/dL — AB (ref 0.50–1.35)
GFR calc Af Amer: >90 mL/min (ref >90)
GFR calc non Af Amer: >90 mL/min (ref >90)
Glucose, Bld: 116 mg/dL — ABNORMAL HIGH (ref 70–99)
Potassium: 4 mEq/L (ref 3.7–5.3)
Sodium: 134 mEq/L — ABNORMAL LOW (ref 137–147)
TOTAL PROTEIN: 6.8 g/dL (ref 6.0–8.3)

## 2013-10-04 LAB — URINE MICROSCOPIC-ADD ON

## 2013-10-04 LAB — CBC WITH DIFFERENTIAL/PLATELET
BASOS ABS: 0 10*3/uL (ref 0.0–0.1)
BASOS PCT: 1 % (ref 0–1)
EOS PCT: 1 % (ref 0–5)
Eosinophils Absolute: 0 10*3/uL (ref 0.0–0.7)
HCT: 40.1 % (ref 39.0–52.0)
Hemoglobin: 13.8 g/dL (ref 13.0–17.0)
Lymphocytes Relative: 18 % (ref 12–46)
Lymphs Abs: 1.1 10*3/uL (ref 0.7–4.0)
MCH: 31.1 pg (ref 26.0–34.0)
MCHC: 34.4 g/dL (ref 30.0–36.0)
MCV: 90.3 fL (ref 78.0–100.0)
MONOS PCT: 13 % — AB (ref 3–12)
Monocytes Absolute: 0.8 10*3/uL (ref 0.1–1.0)
NEUTROS ABS: 4.2 10*3/uL (ref 1.7–7.7)
Neutrophils Relative %: 67 % (ref 43–77)
Platelets: 169 10*3/uL (ref 150–400)
RBC: 4.44 MIL/uL (ref 4.22–5.81)
RDW: 14 % (ref 11.5–15.5)
WBC: 6.1 10*3/uL (ref 4.0–10.5)

## 2013-10-04 LAB — LIPASE, BLOOD: LIPASE: 43 U/L (ref 11–59)

## 2013-10-04 LAB — I-STAT CG4 LACTIC ACID, ED: LACTIC ACID, VENOUS: 1.33 mmol/L (ref 0.5–2.2)

## 2013-10-04 MED ORDER — ONDANSETRON HCL 4 MG/2ML IJ SOLN: 4.0000 mg | Freq: Four times a day (QID) | INTRAMUSCULAR | Status: DC | PRN

## 2013-10-04 MED ORDER — ENSURE COMPLETE PO LIQD
237.0000 mL | Freq: Two times a day (BID) | ORAL | Status: DC
  Administered 2013-10-05 (×2): 237 mL via ORAL

## 2013-10-04 MED ORDER — FLEET ENEMA 7-19 GM/118ML RE ENEM: 1.0000 | ENEMA | Freq: Once | RECTAL | Status: AC | PRN

## 2013-10-04 MED ORDER — POTASSIUM CHLORIDE IN NACL 20-0.9 MEQ/L-% IV SOLN
INTRAVENOUS | Status: DC
  Administered 2013-10-04: 22:00:00 via INTRAVENOUS
  Administered 2013-10-05: 1000 mL via INTRAVENOUS
  Administered 2013-10-06: via INTRAVENOUS
  Filled 2013-10-04 (×7): qty 1000

## 2013-10-04 MED ORDER — ACETAMINOPHEN 650 MG RE SUPP: 650.0000 mg | Freq: Four times a day (QID) | RECTAL | Status: DC | PRN

## 2013-10-04 MED ORDER — ENOXAPARIN SODIUM 40 MG/0.4ML ~~LOC~~ SOLN
40.0000 mg | Freq: Every day | SUBCUTANEOUS | Status: DC
  Administered 2013-10-04 – 2013-10-05 (×2): 40 mg via SUBCUTANEOUS
  Filled 2013-10-04 (×3): qty 0.4

## 2013-10-04 MED ORDER — OXYCODONE HCL 5 MG PO TABS: 5.0000 mg | ORAL_TABLET | ORAL | Status: DC | PRN

## 2013-10-04 MED ORDER — BISACODYL 10 MG RE SUPP: 10.0000 mg | Freq: Every day | RECTAL | Status: DC | PRN

## 2013-10-04 MED ORDER — LEVOTHYROXINE SODIUM 150 MCG PO TABS
150.0000 ug | ORAL_TABLET | Freq: Every day | ORAL | Status: DC
  Administered 2013-10-05 – 2013-10-06 (×2): 150 ug via ORAL
  Filled 2013-10-04 (×3): qty 1

## 2013-10-04 MED ORDER — ACETAMINOPHEN 325 MG PO TABS: 650.0000 mg | ORAL_TABLET | Freq: Four times a day (QID) | ORAL | Status: DC | PRN

## 2013-10-04 MED ORDER — METRONIDAZOLE IN NACL 5-0.79 MG/ML-% IV SOLN
500.0000 mg | Freq: Three times a day (TID) | INTRAVENOUS | Status: DC
  Administered 2013-10-04 – 2013-10-06 (×5): 500 mg via INTRAVENOUS
  Filled 2013-10-04 (×7): qty 100

## 2013-10-04 MED ORDER — DIPHENHYDRAMINE HCL 50 MG/ML IJ SOLN
25.0000 mg | Freq: Once | INTRAMUSCULAR | Status: AC
  Administered 2013-10-04: 25 mg via INTRAVENOUS
  Filled 2013-10-04: qty 1

## 2013-10-04 MED ORDER — METHYLPREDNISOLONE SODIUM SUCC 125 MG IJ SOLR
125.0000 mg | Freq: Once | INTRAMUSCULAR | Status: AC
  Administered 2013-10-04: 125 mg via INTRAVENOUS
  Filled 2013-10-04: qty 2

## 2013-10-04 MED ORDER — PIPERACILLIN-TAZOBACTAM 3.375 G IVPB 30 MIN
3.3750 g | Freq: Once | INTRAVENOUS | Status: AC
  Administered 2013-10-04: 3.375 g via INTRAVENOUS
  Filled 2013-10-04: qty 50

## 2013-10-04 MED ORDER — OXYCODONE HCL 5 MG PO CAPS: 5.0000 mg | ORAL_CAPSULE | ORAL | Status: DC | PRN

## 2013-10-04 MED ORDER — BIOTENE DRY MOUTH MT LIQD
15.0000 mL | Freq: Two times a day (BID) | OROMUCOSAL | Status: DC
  Administered 2013-10-05 (×2): 15 mL via OROMUCOSAL

## 2013-10-04 MED ORDER — PANTOPRAZOLE SODIUM 40 MG IV SOLR
40.0000 mg | Freq: Every day | INTRAVENOUS | Status: DC
  Administered 2013-10-04: 40 mg via INTRAVENOUS
  Filled 2013-10-04 (×2): qty 40

## 2013-10-04 MED ORDER — ONDANSETRON HCL 4 MG PO TABS: 4.0000 mg | ORAL_TABLET | Freq: Four times a day (QID) | ORAL | Status: DC | PRN

## 2013-10-04 MED ORDER — CHLORHEXIDINE GLUCONATE 0.12 % MT SOLN
15.0000 mL | Freq: Two times a day (BID) | OROMUCOSAL | Status: DC
  Administered 2013-10-05 (×2): 15 mL via OROMUCOSAL
  Filled 2013-10-04 (×6): qty 15

## 2013-10-04 MED ORDER — ALUM & MAG HYDROXIDE-SIMETH 200-200-20 MG/5ML PO SUSP: 30.0000 mL | Freq: Four times a day (QID) | ORAL | Status: DC | PRN

## 2013-10-04 MED ORDER — DEXTROSE 5 % IV SOLN
1.0000 g | INTRAVENOUS | Status: DC
  Administered 2013-10-04 – 2013-10-05 (×2): 1 g via INTRAVENOUS
  Filled 2013-10-04 (×3): qty 10

## 2013-10-04 NOTE — ED Notes (Signed)
Pt given urinal and made aware of need for urine specimen 

## 2013-10-04 NOTE — ED Notes (Signed)
Patient transported to X-ray 

## 2013-10-04 NOTE — ED Provider Notes (Signed)
CSN: [id number]     Arrival date & time 10/04/13  1505 History   First MD Initiated Contact with Patient 10/04/13 1516     Chief Complaint  Patient presents with  . Weakness     (Consider location/radiation/quality/duration/timing/severity/associated sxs/prior Treatment) HPI Comments: Patient is an 80 year old male with history of pancreatic cancer, hypothyroidism, and obstructive sleep apnea who presents today with complaints of generalized weakness. He was diagnosed with pancreatic cancer in march while in [place]. In April the physicians at [facility] attempted to do a Whipple procedure, but cut the surgery short due to the amount of metastatic liver cancer. He was then diagnosed with an abscess on his gallbladder and a biliary stent was placed as well as a drain. Yesterday the drain was removed and the stent was replaced at [facility]. He was feeling weak yesterday which he attributed to the sedation that was used during the procedure. He felt worse today. Per his wife he was only able to eat oatmeal with blueberries and a small amount of boost. He states his legs feel heavy and states this morning he was unable to get up from the chair. He was able to ambulate to the stretcher for EMS. He has had a fever as high as 100.18F and was tachycardic at 108 at home. He has been taking Keflex and Flagyl which the physicians at [facility] told him he was ok to stop taking yesterday.   The history is provided by the patient. No language interpreter was used.    Past Medical History  Diagnosis Date  . OSA (obstructive sleep apnea)   . Hypothyroidism   . Pancreatic cancer    Past Surgical History  Procedure Laterality Date  . Appendectomy    . Prostate surgery     Family History  Problem Relation Age of Onset  . Diabetes    . Cancer Maternal Grandmother   . Sleep apnea Son    History  Substance Use Topics  . Smoking status: Never Smoker   . Smokeless tobacco: Not on file  . Alcohol Use: Yes     Comment:  occ    Review of Systems  Constitutional: Positive for activity change, appetite change and fatigue.  Respiratory: Negative for shortness of breath.   Cardiovascular: Negative for chest pain.  Gastrointestinal: Negative for nausea, vomiting and abdominal pain.  Neurological: Positive for weakness (generalized).  All other systems reviewed and are negative.     Allergies  Penicillins  Home Medications   Prior to Admission medications   Medication Sig Start Date End Date Taking? Authorizing Provider  levothyroxine (SYNTHROID, LEVOTHROID) 150 MCG tablet Take 150 mcg by mouth daily before breakfast.    Historical Provider, MD   BP 119/64  Pulse 76  Temp(Src) 97 F (36.1 C) (Oral)  Resp 15  Ht 5\' 10"  (1.778 m)  Wt 176 lb 5.9 oz (80 kg)  BMI 25.31 kg/m2  SpO2 100% Physical Exam  Nursing note and vitals reviewed. Constitutional: He is oriented to person, place, and time. He appears well-developed and well-nourished. No distress.  HENT:  Head: Normocephalic and atraumatic.  Right Ear: External ear normal.  Left Ear: External ear normal.  Nose: Nose normal.  Eyes: Conjunctivae are normal.  Neck: Normal range of motion. No tracheal deviation present.  Cardiovascular: Normal rate, regular rhythm and normal heart sounds.   Pulmonary/Chest: Effort normal and breath sounds normal. No stridor.  Abdominal: Soft. He exhibits no distension. There is no tenderness.  Musculoskeletal:  Normal range of motion.  Neurological: He is alert and oriented to person, place, and time.  Skin: Skin is warm and dry. He is not diaphoretic.  Psychiatric: He has a normal mood and affect. His behavior is normal.    ED Course  Procedures (including critical care time) Labs Review Labs Reviewed  CBC WITH DIFFERENTIAL - Abnormal; Notable for the following:    Monocytes Relative 13 (*)    All other components within normal limits  COMPREHENSIVE METABOLIC PANEL - Abnormal; Notable for the following:     Sodium 134 (*)    Chloride 95 (*)    Glucose, Bld 116 (*)    BUN 5 (*)    Creatinine, Ser 0.37 (*)    Albumin 3.0 (*)    All other components within normal limits  URINALYSIS, ROUTINE W REFLEX MICROSCOPIC - Abnormal; Notable for the following:    Color, Urine AMBER (*)    APPearance CLOUDY (*)    Bilirubin Urine SMALL (*)    Leukocytes, UA TRACE (*)    All other components within normal limits  URINE MICROSCOPIC-ADD ON - Abnormal; Notable for the following:    Bacteria, UA FEW (*)    Casts HYALINE CASTS (*)    All other components within normal limits  COMPREHENSIVE METABOLIC PANEL - Abnormal; Notable for the following:    Sodium 136 (*)    Glucose, Bld 154 (*)    Creatinine, Ser 0.44 (*)    Albumin 2.8 (*)    All other components within normal limits  CULTURE, BLOOD (ROUTINE X 2)  CULTURE, BLOOD (ROUTINE X 2)  URINE CULTURE  LIPASE, BLOOD  CBC  I-STAT CG4 LACTIC ACID, ED  Randolm Idol, ED    Imaging Review Dg Chest 2 View  10/04/2013   CLINICAL DATA:  Shortness of Breath  EXAM: CHEST  2 VIEW  COMPARISON:  August 10, 2013  FINDINGS: There is scarring in the left base. There is a small stable granuloma in the right mid lung. Elsewhere lungs are clear. Heart is upper normal in size with normal pulmonary vascularity. No adenopathy. There is degenerative change in the thoracic spine. There are no blastic or lytic bone lesions.  IMPRESSION: Scarring left base.  No edema or consolidation.  No adenopathy.   Electronically Signed   By: Lowella Grip M.D.   On: 10/04/2013 15:54   US Abdomen Complete  10/04/2013   CLINICAL DATA:  Pancreatic carcinoma. Fever. Status post replacement of biliary stent yesterday.  EXAM: ULTRASOUND ABDOMEN COMPLETE  COMPARISON:  None.  FINDINGS: Gallbladder:  Percutaneous cholecystostomy tube seen in the gallbladder fossa. Gallbladder is not well visualized or evaluated on this exam.  Common bile duct:  Diameter: Not well visualized some gas noted  intrahepatic bile ducts, consistent with sphincterotomy and stent placement.  Liver:  No focal lesion identified. Within normal limits in parenchymal echogenicity.  IVC:  No abnormality visualized.  Pancreas:  A hypoechoic masslike area seen in the region of the pancreatic head measuring approximately 3 cm, likely due representing the known pancreatic carcinoma. Other smaller 1-2 cm hypoechoic densities in the region of the pancreatic neck and body are suspicious for peripancreatic lymphadenopathy.  Spleen:  Size and appearance within normal limits.  Right Kidney:  Length: 13.0 cm. Echogenicity within normal limits. No mass or hydronephrosis visualized.  Left Kidney:  Length: 13.6 cm. Echogenicity within normal limits. No mass or hydronephrosis visualized. Simple cyst noted in upper pole measuring 3.5 cm.  Abdominal aorta:  No aneurysm visualized.  Other findings:  None.  IMPRESSION: Poor visualization of gallbladder due to percutaneous cholecystostomy tube.  Nonvisualization of common bile duct. Gas noted intrahepatic bile ducts, consistent with prior sphincterotomy and stent placement.  3 cm hypoechoic mass in region of pancreatic head consistent with known pancreatic carcinoma. Probable mild peripancreatic lymphadenopathy.   Electronically Signed   By: Earle Gell M.D.   On: 10/04/2013 18:54     EKG Interpretation   Date/Time:  Wednesday Oct 04 2013 15:54:28 EDT Ventricular Rate:  91 PR Interval:  174 QRS Duration: 125 QT Interval:  351 QTC Calculation: 432 R Axis:   -63 Text Interpretation:  Sinus rhythm Nonspecific IVCD with LAD Abnormal T,  consider ischemia, lateral leads No prior for comparison Confirmed by  HORTON  MD, COURTNEY ([id number]) on 10/04/2013 4:02:44 PM      MDM   Final diagnoses:  Fever   Patient with pancreatic cancer, recent biliary stent replacement yesterday presents with generalized weakness and fever. Labs and [place] are reassuring. Discussed case with Dr. Jerolyn Center with  medical oncology at [facility] who recommends admission to the hospital. She does not feel as though the patient needs to be transferred back to [facility] at this time. The patient is hemodynamically stable. Discussed case with on call physician for [facility] who will admit patient. Admission is appreciated. Discussed case with Dr. Dina Rich who agrees with plan. Patient / Family / Caregiver informed of clinical course, understand medical decision-making process, and agree with plan.     Elwyn Lade, PA-C 10/05/13 1148

## 2013-10-04 NOTE — H&P (Signed)
Seth King is an 80 y.o. male.   Chief Complaint: fever and feeling weak HPI:  The patient is an 80 year old man with a diagnosis of pancreatic adenocarcinoma in Mar 2015 who had a biliary stent placed in 4/15, subsequently had an aborted Whipple Procedure done due to evidence of hepatic mets, and yesterday had exchange of the biliary stent and removal of a percutaneous cholecystotomy tube done at [facility]. He had decreased appetite yesterday, and today had mild nausea with decreased appetite, global weakness with difficulty walking, and fever. He was advised to come to the ER for evaluation. He has not had sinus congestion, headache, cough, significant abdominal pain, dysuria or frequency. He has had mild constipation, no diarrhea despite pre-stent Flagy treatment.  He and his wife request a DNR order.  Past Medical History  Diagnosis Date  . OSA (obstructive sleep apnea)   . Hypothyroidism   . Pancreatic cancer      (Not in a hospital admission)  ADDITIONAL HOME MEDICATIONS: no additional home meds  PHYSICIANS INVOLVED IN CARE: Tonna Boehringer (PCP), Dr. Mariah Milling ([facility])  Past Surgical History  Procedure Laterality Date  . Appendectomy    . Prostate surgery    . Ercp w/ plastic stent placement N/A 08/15/2013    Family History  Problem Relation Age of Onset  . Diabetes    . Cancer Maternal Grandmother   . Sleep apnea Son      Social History:  reports that he has never smoked. He has never used smokeless tobacco. He reports that he drinks alcohol. He reports that he does not use illicit drugs.  Allergies:  Allergies  Allergen Reactions  . Penicillins Itching     ROS: cancer/tumor, liver disease (hepatitis/jaundice), weight loss and decreased appetite, weakness, fever  PHYSICAL EXAM: Blood pressure 141/58, pulse 93, temperature 100.5 F (38.1 C), temperature source Rectal, resp. rate 23, SpO2 100.00%. In general, the patient is a well nourished and  well-developed white man who was in no apparent distress. HEENT exam was normal, neck was supple and without JVD or bruit, chest was clear to auscultation, heart had a regular rate and rhythm without murmur, abdomen had decreased bowel sounds and no tenderness, extremities were without cyanosis, clubbing, or edema, neurologic exam was nonfocal.  Results for orders placed during the hospital encounter of 10/04/13 (from the past 48 hour(s))  CBC WITH DIFFERENTIAL     Status: Abnormal   Collection Time    10/04/13  4:11 PM      Result Value Ref Range   WBC 6.1  4.0 - 10.5 K/uL   RBC 4.44  4.22 - 5.81 MIL/uL   Hemoglobin 13.8  13.0 - 17.0 g/dL   HCT 40.1  39.0 - 52.0 %   MCV 90.3  78.0 - 100.0 fL   MCH 31.1  26.0 - 34.0 pg   MCHC 34.4  30.0 - 36.0 g/dL   RDW 14.0  11.5 - 15.5 %   Platelets 169  150 - 400 K/uL   Neutrophils Relative % 67  43 - 77 %   Neutro Abs 4.2  1.7 - 7.7 K/uL   Lymphocytes Relative 18  12 - 46 %   Lymphs Abs 1.1  0.7 - 4.0 K/uL   Monocytes Relative 13 (*) 3 - 12 %   Monocytes Absolute 0.8  0.1 - 1.0 K/uL   Eosinophils Relative 1  0 - 5 %   Eosinophils Absolute 0.0  0.0 -  0.7 K/uL   Basophils Relative 1  0 - 1 %   Basophils Absolute 0.0  0.0 - 0.1 K/uL  COMPREHENSIVE METABOLIC PANEL     Status: Abnormal   Collection Time    10/04/13  4:11 PM      Result Value Ref Range   Sodium 134 (*) 137 - 147 mEq/L   Potassium 4.0  3.7 - 5.3 mEq/L   Chloride 95 (*) 96 - 112 mEq/L   CO2 26  19 - 32 mEq/L   Glucose, Bld 116 (*) 70 - 99 mg/dL   BUN 5 (*) 6 - 23 mg/dL   Creatinine, Ser 0.37 (*) 0.50 - 1.35 mg/dL   Calcium 9.0  8.4 - 10.5 mg/dL   Total Protein 6.8  6.0 - 8.3 g/dL   Albumin 3.0 (*) 3.5 - 5.2 g/dL   AST 33  0 - 37 U/L   ALT 19  0 - 53 U/L   Alkaline Phosphatase 70  39 - 117 U/L   Total Bilirubin 0.8  0.3 - 1.2 mg/dL   GFR calc non Af Amer >90  >90 mL/min   GFR calc Af Amer >90  >90 mL/min   Comment: (NOTE)     The eGFR has been calculated using the CKD EPI  equation.     This calculation has not been validated in all clinical situations.     eGFR's persistently <90 mL/min signify possible Chronic Kidney     Disease.  LIPASE, BLOOD     Status: None   Collection Time    10/04/13  4:11 PM      Result Value Ref Range   Lipase 43  11 - 59 U/L  I-STAT TROPOININ, ED     Status: None   Collection Time    10/04/13  4:20 PM      Result Value Ref Range   Troponin i, poc 0.02  0.00 - 0.08 ng/mL   Comment 3            Comment: Due to the release kinetics of cTnI,     a negative result within the first hours     of the onset of symptoms does not rule out     myocardial infarction with certainty.     If myocardial infarction is still suspected,     repeat the test at appropriate intervals.  I-STAT CG4 LACTIC ACID, ED     Status: None   Collection Time    10/04/13  4:22 PM      Result Value Ref Range   Lactic Acid, Venous 1.33  0.5 - 2.2 mmol/L  URINALYSIS, ROUTINE W REFLEX MICROSCOPIC     Status: Abnormal   Collection Time    10/04/13  5:00 PM      Result Value Ref Range   Color, Urine AMBER (*) YELLOW   Comment: BIOCHEMICALS MAY BE AFFECTED BY COLOR   APPearance CLOUDY (*) CLEAR   Specific Gravity, Urine 1.020  1.005 - 1.030   pH 7.5  5.0 - 8.0   Glucose, UA NEGATIVE  NEGATIVE mg/dL   Hgb urine dipstick NEGATIVE  NEGATIVE   Bilirubin Urine SMALL (*) NEGATIVE   Ketones, ur NEGATIVE  NEGATIVE mg/dL   Protein, ur NEGATIVE  NEGATIVE mg/dL   Urobilinogen, UA 0.2  0.0 - 1.0 mg/dL   Nitrite NEGATIVE  NEGATIVE   Leukocytes, UA TRACE (*) NEGATIVE  URINE MICROSCOPIC-ADD ON     Status: Abnormal   Collection Time  10/04/13  5:00 PM      Result Value Ref Range   Squamous Epithelial / LPF RARE  RARE   WBC, UA 3-6  <3 WBC/hpf   RBC / HPF 0-2  <3 RBC/hpf   Bacteria, UA FEW (*) RARE   Casts HYALINE CASTS (*) NEGATIVE   Urine-Other MUCOUS PRESENT     Dg Chest 2 View  10/04/2013   CLINICAL DATA:  Shortness of Breath  EXAM: CHEST  2 VIEW   COMPARISON:  August 10, 2013  FINDINGS: There is scarring in the left base. There is a small stable granuloma in the right mid lung. Elsewhere lungs are clear. Heart is upper normal in size with normal pulmonary vascularity. No adenopathy. There is degenerative change in the thoracic spine. There are no blastic or lytic bone lesions.  IMPRESSION: Scarring left base.  No edema or consolidation.  No adenopathy.   Electronically Signed   By: Lowella Grip M.D.   On: 10/04/2013 15:54   US Abdomen Complete  10/04/2013   CLINICAL DATA:  Pancreatic carcinoma. Fever. Status post replacement of biliary stent yesterday.  EXAM: ULTRASOUND ABDOMEN COMPLETE  COMPARISON:  None.  FINDINGS: Gallbladder:  Percutaneous cholecystostomy tube seen in the gallbladder fossa. Gallbladder is not well visualized or evaluated on this exam.  Common bile duct:  Diameter: Not well visualized some gas noted intrahepatic bile ducts, consistent with sphincterotomy and stent placement.  Liver:  No focal lesion identified. Within normal limits in parenchymal echogenicity.  IVC:  No abnormality visualized.  Pancreas:  A hypoechoic masslike area seen in the region of the pancreatic head measuring approximately 3 cm, likely due representing the known pancreatic carcinoma. Other smaller 1-2 cm hypoechoic densities in the region of the pancreatic neck and body are suspicious for peripancreatic lymphadenopathy.  Spleen:  Size and appearance within normal limits.  Right Kidney:  Length: 13.0 cm. Echogenicity within normal limits. No mass or hydronephrosis visualized.  Left Kidney:  Length: 13.6 cm. Echogenicity within normal limits. No mass or hydronephrosis visualized. Simple cyst noted in upper pole measuring 3.5 cm.  Abdominal aorta:  No aneurysm visualized.  Other findings:  None.  IMPRESSION: Poor visualization of gallbladder due to percutaneous cholecystostomy tube.  Nonvisualization of common bile duct. Gas noted intrahepatic bile ducts,  consistent with prior sphincterotomy and stent placement.  3 cm hypoechoic mass in region of pancreatic head consistent with known pancreatic carcinoma. Probable mild peripancreatic lymphadenopathy.   Electronically Signed   By: Earle Gell M.D.   On: 10/04/2013 18:54     Assessment/Plan #1 Fever: likely due to recent instrumentation of the biliary tract with bacteremia. Sxs could be due to developing abscess or cholangitis with removal of the gallbladder drain yesterday. We will obtain blood and urine cultures, and start him on rocephin and flagyl due to his history of allergy (itching) to penicillin. Will give IVF and recheck CBC in the AM.  #2 Pancreatic Adenocarcinoma: clinically stable although he and his wife realize that his prognosis is poor and request him to have a DNR order.  #3 Protein Calorie Malnutrition: severe, and from his pancreatic cancer. Will start with  Full liquids diet, add nutrition supplements. #4 Obstructive Sleep Apnea: stable and we will continue CPAP as inpatient.   Leanna Battles 10/04/2013, 8:10 PM

## 2013-10-04 NOTE — ED Notes (Signed)
Bed: WA17 Expected date:  Expected time:  Means of arrival:  Comments: ems 

## 2013-10-04 NOTE — ED Notes (Signed)
Per EMS, pt had a pancreatic stent placed at [facility] yesterday.  Drain was removed at same time.  Today, pt with increased weakness and no energy.  Pt was reported to have high fever at home.  Per EMS, cool to touch.  Pt able to ambulate on his own to stretcher.  Pt was with walker at home.  Lives with his wife/son.  Vitals:  128/60, hr 100, resp 18.

## 2013-10-04 NOTE — Progress Notes (Signed)
Utilization Review completed.  Ramello Cordial RN CM  

## 2013-10-05 DIAGNOSIS — E43 Unspecified severe protein-calorie malnutrition: Secondary | ICD-10-CM | POA: Diagnosis present

## 2013-10-05 LAB — COMPREHENSIVE METABOLIC PANEL
ALT: 18 U/L (ref 0–53)
AST: 29 U/L (ref 0–37)
Albumin: 2.8 g/dL — ABNORMAL LOW (ref 3.5–5.2)
Alkaline Phosphatase: 65 U/L (ref 39–117)
BUN: 7 mg/dL (ref 6–23)
CALCIUM: 9.1 mg/dL (ref 8.4–10.5)
CO2: 25 meq/L (ref 19–32)
Chloride: 101 mEq/L (ref 96–112)
Creatinine, Ser: 0.44 mg/dL — ABNORMAL LOW (ref 0.50–1.35)
GFR calc non Af Amer: >90 mL/min (ref >90)
Glucose, Bld: 154 mg/dL — ABNORMAL HIGH (ref 70–99)
Potassium: 4.4 mEq/L (ref 3.7–5.3)
SODIUM: 136 meq/L — AB (ref 137–147)
TOTAL PROTEIN: 6.4 g/dL (ref 6.0–8.3)
Total Bilirubin: 0.6 mg/dL (ref 0.3–1.2)

## 2013-10-05 LAB — CBC
HCT: 39.5 % (ref 39.0–52.0)
HEMOGLOBIN: 13.4 g/dL (ref 13.0–17.0)
MCH: 30.9 pg (ref 26.0–34.0)
MCHC: 33.9 g/dL (ref 30.0–36.0)
MCV: 91 fL (ref 78.0–100.0)
Platelets: 155 10*3/uL (ref 150–400)
RBC: 4.34 MIL/uL (ref 4.22–5.81)
RDW: 13.9 % (ref 11.5–15.5)
WBC: 4.1 10*3/uL (ref 4.0–10.5)

## 2013-10-05 LAB — URINE CULTURE
Colony Count: NO GROWTH
Culture: NO GROWTH

## 2013-10-05 MED ORDER — PANTOPRAZOLE SODIUM 40 MG PO TBEC
40.0000 mg | DELAYED_RELEASE_TABLET | Freq: Every day | ORAL | Status: DC
  Administered 2013-10-05: 40 mg via ORAL
  Filled 2013-10-05 (×2): qty 1

## 2013-10-05 NOTE — Progress Notes (Signed)
INITIAL NUTRITION ASSESSMENT  DOCUMENTATION CODES Per approved criteria  -Severe malnutrition in the context of chronic illness  Pt meets criteria for severe MALNUTRITION in the context of chronic illness as evidenced by 12.6% body weight loss in 3 months, PO intake <75% for > one month.  INTERVENTION: -Recommend Ensure Complete po BID, each supplement provides 350 kcal and 13 grams of protein -Provided pt with nutrition education handouts and supplement coupons -Recommend appetite stimulant -Will continue to monitor  NUTRITION DIAGNOSIS: Inadequate oral intake related to decreased appetite/taste changes as evidenced by PO intake <75%, unintentional wt loss.   Goal: Pt to meet >/= 90% of their estimated nutrition needs    Monitor:  Total protein/energy intake, labs, weights  Reason for Assessment: MST/RN Consult  80 y.o. male  Admitting Dx: Fever  ASSESSMENT: The patient is an 80 year old man with a diagnosis of pancreatic adenocarcinoma in Mar 2015 who had a biliary stent placed in 4/15, subsequently had an aborted Whipple Procedure done due to evidence of hepatic mets, and yesterday had exchange of the biliary stent and removal of a percutaneous cholecystotomy tube done at [facility]. He had decreased appetite yesterday, and today had mild nausea with decreased appetite, global weakness with difficulty walking, and fever. He was advised to come to the ER for evaluation  -Pt endorsed overall decreased appetite, taste changes, and early satiety since 06/2013 -Endorsed an unintentional wt loss of approximately 25 lbs in past 2-3 months -Diet recall indicates pt consuming 3 small meals/day, consisting of only 5-6 bites per wife report. Has been encouraging protein supplements, such as Boost Plus and whey protein. Pt typically consumes 1-1.5 Boost drinks/daily -Pt with <50% PO intake of full liquid diet. Encouraged intake of high kcal/protein foods -Provided pt with nutrition  education handouts from [employer]- "Poor Appetite", "Increasing Calories and Protein", and "Taste Changes" -Discussed snack options, ways to improve taste, and increasing protein/kcal content of foods pt consumes -Would benefit from appetite stimulant d/t prolonged period of sub-optimal nutrition -Denied nausea/abd pain. Some constipation noted per MD  Height: Ht Readings from Last 1 Encounters:  10/04/13 5\' 10"  (1.778 m)    Weight: Wt Readings from Last 1 Encounters:  10/04/13 176 lb 5.9 oz (80 kg)    Ideal Body Weight: 166 lbs  % Ideal Body Weight: 106%  Wt Readings from Last 10 Encounters:  10/04/13 176 lb 5.9 oz (80 kg)  08/07/13 198 lb 12.8 oz (90.175 kg)  08/09/08 240 lb 6.1 oz (109.036 kg)  02/17/08 236 lb (107.049 kg)  03/31/07 241 lb (109.317 kg)  02/25/06 247 lb (112.038 kg)    Usual Body Weight: 200 lbs  % Usual Body Weight: 88%  BMI:  Body mass index is 25.31 kg/(m^2).  Estimated Nutritional Needs: Kcal: 2000-2200 Protein: 95-105 gram Fluid: >/=2000 ml/daily  Skin: WDL  Diet Order: Full Liquid  EDUCATION NEEDS: -Education needs addressed   Intake/Output Summary (Last 24 hours) at 10/05/13 1250 Last data filed at 10/05/13 0926  Gross per 24 hour  Intake    360 ml  Output    900 ml  Net   -540 ml    Last BM: 5/26   Labs:   Recent Labs Lab 10/04/13 1611 10/05/13 0335  NA 134* 136*  K 4.0 4.4  CL 95* 101  CO2 26 25  BUN 5* 7  CREATININE 0.37* 0.44*  CALCIUM 9.0 9.1  GLUCOSE 116* 154*    CBG (last 3)  No results found for this basename: GLUCAP,  in the last 72 hours  Scheduled Meds: . antiseptic oral rinse  15 mL Mouth Rinse q12n4p  . cefTRIAXone (ROCEPHIN)  IV  1 g Intravenous Q24H  . chlorhexidine  15 mL Mouth Rinse BID  . enoxaparin (LOVENOX) injection  40 mg Subcutaneous Q2000  . feeding supplement (ENSURE COMPLETE)  237 mL Oral BID BM  . levothyroxine  150 mcg Oral QAC breakfast  . metronidazole   500 mg Intravenous Q8H  . pantoprazole  40 mg Oral QHS    Continuous Infusions: . 0.9 % NaCl with KCl 20 mEq / L 1,000 mL (10/05/13 1009)    Past Medical History  Diagnosis Date  . OSA (obstructive sleep apnea)   . Hypothyroidism   . Pancreatic cancer     Past Surgical History  Procedure Laterality Date  . Appendectomy    . Prostate surgery    . Ercp w/ plastic stent placement N/A 08/15/2013    Atlee Abide [address] LDN Clinical Dietitian [phone]

## 2013-10-05 NOTE — Progress Notes (Signed)
Subjective: He feels fine this morning and slept well, no nausea or abdominal pain.  Objective: Vital signs in last 24 hours: Temp:  [97 F (36.1 C)-100.5 F (38.1 C)] 97 F (36.1 C) (05/28 0512) Pulse Rate:  [76-97] 76 (05/28 0512) Resp:  [15-23] 15 (05/28 0512) BP: (117-146)/(56-68) 119/64 mmHg (05/28 0512) SpO2:  [98 %-100 %] 100 % (05/28 0512) Weight:  [80 kg (176 lb 5.9 oz)] 80 kg (176 lb 5.9 oz) (05/27 2041) Weight change:    Intake/Output from previous day: 05/27 0701 - 05/28 0700 In: 360 [P.O.:360] Out: 700 [Urine:700]   General appearance: alert, cooperative and no distress Resp: clear to auscultation bilaterally Cardio: regular rate and rhythm GI: soft, non-tender; bowel sounds normal; no masses,  no organomegaly Extremities: extremities normal, atraumatic, no cyanosis or edema  Lab Results:  Recent Labs  10/04/13 1611 10/05/13 0335  WBC 6.1 4.1  HGB 13.8 13.4  HCT 40.1 39.5  PLT 169 155   BMET  Recent Labs  10/04/13 1611 10/05/13 0335  NA 134* 136*  K 4.0 4.4  CL 95* 101  CO2 26 25  GLUCOSE 116* 154*  BUN 5* 7  CREATININE 0.37* 0.44*  CALCIUM 9.0 9.1   CMET CMP     Component Value Date/Time   NA 136* 10/05/2013 0335   K 4.4 10/05/2013 0335   CL 101 10/05/2013 0335   CO2 25 10/05/2013 0335   GLUCOSE 154* 10/05/2013 0335   BUN 7 10/05/2013 0335   CREATININE 0.44* 10/05/2013 0335   CALCIUM 9.1 10/05/2013 0335   PROT 6.4 10/05/2013 0335   ALBUMIN 2.8* 10/05/2013 0335   AST 29 10/05/2013 0335   ALT 18 10/05/2013 0335   ALKPHOS 65 10/05/2013 0335   BILITOT 0.6 10/05/2013 0335   GFRNONAA >90 10/05/2013 0335   GFRAA >90 10/05/2013 0335    CBG (last 3)  No results found for this basename: GLUCAP,  in the last 72 hours  INR RESULTS:   No results found for this basename: INR, PROTIME     Studies/Results: Dg Chest 2 View  10/04/2013   CLINICAL DATA:  Shortness of Breath  EXAM: CHEST  2 VIEW  COMPARISON:  August 10, 2013  FINDINGS: There is scarring  in the left base. There is a small stable granuloma in the right mid lung. Elsewhere lungs are clear. Heart is upper normal in size with normal pulmonary vascularity. No adenopathy. There is degenerative change in the thoracic spine. There are no blastic or lytic bone lesions.  IMPRESSION: Scarring left base.  No edema or consolidation.  No adenopathy.   Electronically Signed   By: Lowella Grip M.D.   On: 10/04/2013 15:54   US Abdomen Complete  10/04/2013   CLINICAL DATA:  Pancreatic carcinoma. Fever. Status post replacement of biliary stent yesterday.  EXAM: ULTRASOUND ABDOMEN COMPLETE  COMPARISON:  None.  FINDINGS: Gallbladder:  Percutaneous cholecystostomy tube seen in the gallbladder fossa. Gallbladder is not well visualized or evaluated on this exam.  Common bile duct:  Diameter: Not well visualized some gas noted intrahepatic bile ducts, consistent with sphincterotomy and stent placement.  Liver:  No focal lesion identified. Within normal limits in parenchymal echogenicity.  IVC:  No abnormality visualized.  Pancreas:  A hypoechoic masslike area seen in the region of the pancreatic head measuring approximately 3 cm, likely due representing the known pancreatic carcinoma. Other smaller 1-2 cm hypoechoic densities in the region of the pancreatic neck and body are suspicious for peripancreatic  lymphadenopathy.  Spleen:  Size and appearance within normal limits.  Right Kidney:  Length: 13.0 cm. Echogenicity within normal limits. No mass or hydronephrosis visualized.  Left Kidney:  Length: 13.6 cm. Echogenicity within normal limits. No mass or hydronephrosis visualized. Simple cyst noted in upper pole measuring 3.5 cm.  Abdominal aorta:  No aneurysm visualized.  Other findings:  None.  IMPRESSION: Poor visualization of gallbladder due to percutaneous cholecystostomy tube.  Nonvisualization of common bile duct. Gas noted intrahepatic bile ducts, consistent with prior sphincterotomy and stent placement.  3 cm  hypoechoic mass in region of pancreatic head consistent with known pancreatic carcinoma. Probable mild peripancreatic lymphadenopathy.   Electronically Signed   By: Earle Gell M.D.   On: 10/04/2013 18:54    Medications: I have reviewed the patient's current medications.  Assessment/Plan: #1 Fever: unclear cause, perhaps from bacteremia associated with bile duct stent replacement, hopefully not due to developing abscess after removal of percutaneous cholecystostomy. Will continue IV antibiotics for now, check culture results, advance diet, and recheck labs in the AM. #2 Pancreatic cancer: non-resectable, no significant pain to date.   LOS: 1 day   Leanna Battles 10/05/2013, 9:08 AM

## 2013-10-05 NOTE — Progress Notes (Signed)
This patient is receiving Protonix IV. Based on criteria approved by the Pharmacy and Therapeutics Committee, this medication is being converted to the equivalent oral dose form. These criteria include:   . The patient is eating (either orally or per tube) and/or has been taking other orally administered medications for at least 24 hours.  . This patient has no evidence of active gastrointestinal bleeding or impaired GI absorption (gastrectomy, short bowel, patient on TNA or NPO).   If you have questions about this conversion, please contact the pharmacy department.  Minda Ditto, [facility] 10/05/2013 10:29 AM

## 2013-10-06 LAB — COMPREHENSIVE METABOLIC PANEL
ALT: 16 U/L (ref 0–53)
AST: 26 U/L (ref 0–37)
Albumin: 2.5 g/dL — ABNORMAL LOW (ref 3.5–5.2)
Alkaline Phosphatase: 57 U/L (ref 39–117)
BILIRUBIN TOTAL: 0.4 mg/dL (ref 0.3–1.2)
BUN: 10 mg/dL (ref 6–23)
CALCIUM: 8.6 mg/dL (ref 8.4–10.5)
CHLORIDE: 103 meq/L (ref 96–112)
CO2: 24 meq/L (ref 19–32)
Creatinine, Ser: 0.45 mg/dL — ABNORMAL LOW (ref 0.50–1.35)
GFR calc non Af Amer: >90 mL/min (ref >90)
Glucose, Bld: 105 mg/dL — ABNORMAL HIGH (ref 70–99)
Potassium: 4.2 mEq/L (ref 3.7–5.3)
Sodium: 138 mEq/L (ref 137–147)
Total Protein: 5.6 g/dL — ABNORMAL LOW (ref 6.0–8.3)

## 2013-10-06 LAB — CBC
HEMATOCRIT: 37.4 % — AB (ref 39.0–52.0)
Hemoglobin: 12.6 g/dL — ABNORMAL LOW (ref 13.0–17.0)
MCH: 30.7 pg (ref 26.0–34.0)
MCHC: 33.7 g/dL (ref 30.0–36.0)
MCV: 91.2 fL (ref 78.0–100.0)
Platelets: 166 10*3/uL (ref 150–400)
RBC: 4.1 MIL/uL — AB (ref 4.22–5.81)
RDW: 13.9 % (ref 11.5–15.5)
WBC: 9.5 10*3/uL (ref 4.0–10.5)

## 2013-10-06 MED ORDER — ONDANSETRON HCL 4 MG PO TABS: 4.0000 mg | ORAL_TABLET | Freq: Four times a day (QID) | ORAL | Status: AC | PRN

## 2013-10-06 MED ORDER — METRONIDAZOLE 500 MG PO TABS: 500.0000 mg | ORAL_TABLET | Freq: Three times a day (TID) | ORAL | Status: AC

## 2013-10-06 MED ORDER — ENSURE COMPLETE PO LIQD: 237.0000 mL | Freq: Two times a day (BID) | ORAL | Status: AC

## 2013-10-06 MED ORDER — CIPROFLOXACIN HCL 500 MG PO TABS: 500.0000 mg | ORAL_TABLET | Freq: Two times a day (BID) | ORAL | Status: AC

## 2013-10-06 NOTE — Discharge Instructions (Signed)
Fever, Adult A fever is a higher than normal body temperature. In an adult, an oral temperature around 98.6 F (37 C) is considered normal. A temperature of 100.4 F (38 C) or higher is generally considered a fever. Mild or moderate fevers generally have no long-term effects and often do not require treatment. Extreme fever (greater than or equal to 106 F or 41.1 C) can cause seizures. The sweating that may occur with repeated or prolonged fever may cause dehydration. Elderly people can develop confusion during a fever. A measured temperature can vary with:  Age.  Time of day.  Method of measurement (mouth, underarm, rectal, or ear). The fever is confirmed by taking a temperature with a thermometer. Temperatures can be taken different ways. Some methods are accurate and some are not.  An oral temperature is used most commonly. Electronic thermometers are fast and accurate.  An ear temperature will only be accurate if the thermometer is positioned as recommended by the manufacturer.  A rectal temperature is accurate and done for those adults who have a condition where an oral temperature cannot be taken.  An underarm (axillary) temperature is not accurate and not recommended. Fever is a symptom, not a disease.  CAUSES   Infections commonly cause fever.  Some noninfectious causes for fever include:  Some arthritis conditions.  Some thyroid or adrenal gland conditions.  Some immune system conditions.  Some types of cancer.  A medicine reaction.  High doses of certain street drugs such as methamphetamine.  Dehydration.  Exposure to high outside or room temperatures.  Occasionally, the source of a fever cannot be determined. This is sometimes called a "fever of unknown origin" (FUO).  Some situations may lead to a temporary rise in body temperature that may go away on its own. Examples are:  Childbirth.  Surgery.  Intense exercise. HOME CARE INSTRUCTIONS   Take  appropriate medicines for fever. Follow dosing instructions carefully. If you use acetaminophen to reduce the fever, be careful to avoid taking other medicines that also contain acetaminophen. Do not take aspirin for a fever if you are younger than age 19. There is an association with Reye's syndrome. Reye's syndrome is a rare but potentially deadly disease.  If an infection is present and antibiotics have been prescribed, take them as directed. Finish them even if you start to feel better.  Rest as needed.  Maintain an adequate fluid intake. To prevent dehydration during an illness with prolonged or recurrent fever, you may need to drink extra fluid.Drink enough fluids to keep your urine clear or pale yellow.  Sponging or bathing with room temperature water may help reduce body temperature. Do not use ice water or alcohol sponge baths.  Dress comfortably, but do not over-bundle. SEEK MEDICAL CARE IF:   You are unable to keep fluids down.  You develop vomiting or diarrhea.  You are not feeling at least partly better after 3 days.  You develop new symptoms or problems. SEEK IMMEDIATE MEDICAL CARE IF:   You have shortness of breath or trouble breathing.  You develop excessive weakness.  You are dizzy or you faint.  You are extremely thirsty or you are making little or no urine.  You develop new pain that was not there before (such as in the head, neck, chest, back, or abdomen).  You have persistant vomiting and diarrhea for more than 1 to 2 days.  You develop a stiff neck or your eyes become sensitive to light.  You develop a   skin rash.  You have a fever or persistent symptoms for more than 2 to 3 days.  You have a fever and your symptoms suddenly get worse. MAKE SURE YOU:   Understand these instructions.  Will watch your condition.  Will get help right away if you are not doing well or get worse. Document Released: 10/21/2000 Document Revised: 07/20/2011 Document  Reviewed: 02/26/2011 ExitCare Patient Information 2014 [employer].  

## 2013-10-06 NOTE — Progress Notes (Signed)
Notified by [facility] patient had been referred to services by Dr Brigitte Pulse early this week- but admitted to hospital prior to services being initiated. Patient and Dr Brigitte Pulse agreeable to initiating services once pt discharges. Patient information reviewed with Dr Alferd Patee, [place] Director hospice eligible with dx: Pancreatic cancer.  Spoke with patient at bedside and wife, Manuela Schwartz via phone briefly to initiate education related to hospice services, philosophy and team approach to care wife and patient voiced good understanding of information provided.  Pt stated 'I know I don't have much time, but what time I have I want to be able to do things and get some plans settled" he voiced concern about his wife and how she was handling things as his son lives in [place] and his daughter has early onset Alzheimer's. Patient is aware of team support from [place], RN, [place] and plans to further discuss needs once home.  Per discussion plan is to d/c home today by personal vehicle.  Pt/wife aware and agreeable to [facility] admission nurse seeing pt today approximately 2:30pm *Please send completed GOLD DNR form in shadow chart home with pt.  DME needs discussed -  pt stated he has a walker at home and does not feel he has any other needs at this time though he may consider a shower chair once he gets home- DME equipment list given to patient to review and discuss with [facility] admission nurse.  Patient's PCP/attending working with [place] is Dr Brigitte Pulse ; [facility] [place] Initial paperwork faxed to [facility]  Please notify [facility] when patient is ready to leave unit at d/c call [phone] (or [phone] if after 5 pm);  [facility] information and contact numbers also given to patient  during visit.   Above information shared with [facility] Please call with any questions or concerns   Danton Sewer, RN 10/06/2013, 11:30 AM [facility] Liaison [phone]

## 2013-10-06 NOTE — Discharge Summary (Signed)
DISCHARGE SUMMARY  Seth King  MR#: [id number]  DOB:04/29/1932  Date of Admission: 10/04/2013 Date of Discharge: 10/06/2013  Attending [place]  Patient's BPZ:WCHE, Gwyndolyn Saxon, MD  Consults:  None  Discharge Diagnoses: Principal Problem:   Fever Active Problems:   GLUCOSE INTOLERANCE   SLEEP APNEA, OBSTRUCTIVE   Adenocarcinoma of pancreas   Protein-calorie malnutrition, severe  Past Medical History Pancreatic cancer with liver metastases (diagnosed 2/15)--> ERCP with biliary stent placement (2/15, 4/15), aborted Whipple due to liver metastases, declines treatment OSA IFG Cholangitis/Perforated Cholecystitis with intra/extrahepatic abscesses status post percutaneous cholecystectomy drainage (4/15, removed on 10/03/13) Obesity  Past Surgical History Appendectomy Resection Transurethral Prostate endoscopy of biliary duct (08/15/2013) endoscopic retrograde cholangio-pancreatography w/biliary tube/stent (08/15/2013) endoscopic retrograde cholangio-pancreatography w/extraction stone (08/15/2013) Laparoscopic pancreaticoduodenectomy w/pancreaticojejunostomy whipple (N/A, 09/06/2013)- aborted due to hepatic metastases    Discharge Medications:   Medication List    STOP taking these medications       cephALEXin 500 MG capsule  Commonly known as:  KEFLEX      TAKE these medications       ciprofloxacin 500 MG tablet  Commonly known as:  CIPRO  Take 1 tablet (500 mg total) by mouth 2 (two) times daily.     feeding supplement (ENSURE COMPLETE) Liqd  Take 237 mLs by mouth 2 (two) times daily between meals.     levothyroxine 150 MCG tablet  Commonly known as:  SYNTHROID, LEVOTHROID  Take 150 mcg by mouth daily before breakfast.     metroNIDAZOLE 500 MG tablet  Commonly known as:  FLAGYL  Take 1 tablet (500 mg total) by mouth 3 (three) times daily.     ondansetron 4 MG tablet  Commonly known as:  ZOFRAN  Take 1 tablet (4 mg total) by mouth every 6 (six)  hours as needed for nausea.     oxycodone 5 MG capsule  Commonly known as:  OXY-IR  Take 5 mg by mouth every 4 (four) hours as needed for pain.     pantoprazole 40 MG tablet  Commonly known as:  PROTONIX  Take 40 mg by mouth daily.     polyethylene glycol packet  Commonly known as:  MIRALAX / GLYCOLAX  Take 17 g by mouth daily as needed for moderate constipation.        Hospital Procedures: Dg Chest 2 View  10/04/2013   CLINICAL DATA:  Shortness of Breath  EXAM: CHEST  2 VIEW  COMPARISON:  August 10, 2013  FINDINGS: There is scarring in the left base. There is a small stable granuloma in the right mid lung. Elsewhere lungs are clear. Heart is upper normal in size with normal pulmonary vascularity. No adenopathy. There is degenerative change in the thoracic spine. There are no blastic or lytic bone lesions.  IMPRESSION: Scarring left base.  No edema or consolidation.  No adenopathy.   Electronically Signed   By: Lowella Grip M.D.   On: 10/04/2013 15:54   US Abdomen Complete  10/04/2013   CLINICAL DATA:  Pancreatic carcinoma. Fever. Status post replacement of biliary stent yesterday.  EXAM: ULTRASOUND ABDOMEN COMPLETE  COMPARISON:  None.  FINDINGS: Gallbladder:  Percutaneous cholecystostomy tube seen in the gallbladder fossa. Gallbladder is not well visualized or evaluated on this exam.  Common bile duct:  Diameter: Not well visualized some gas noted intrahepatic bile ducts, consistent with sphincterotomy and stent placement.  Liver:  No focal lesion identified. Within normal limits in parenchymal echogenicity.  IVC:  No abnormality visualized.  Pancreas:  A hypoechoic masslike area seen in the region of the pancreatic head measuring approximately 3 cm, likely due representing the known pancreatic carcinoma. Other smaller 1-2 cm hypoechoic densities in the region of the pancreatic neck and body are suspicious for peripancreatic lymphadenopathy.  Spleen:  Size and appearance within normal  limits.  Right Kidney:  Length: 13.0 cm. Echogenicity within normal limits. No mass or hydronephrosis visualized.  Left Kidney:  Length: 13.6 cm. Echogenicity within normal limits. No mass or hydronephrosis visualized. Simple cyst noted in upper pole measuring 3.5 cm.  Abdominal aorta:  No aneurysm visualized.  Other findings:  None.  IMPRESSION: Poor visualization of gallbladder due to percutaneous cholecystostomy tube.  Nonvisualization of common bile duct. Gas noted intrahepatic bile ducts, consistent with prior sphincterotomy and stent placement.  3 cm hypoechoic mass in region of pancreatic head consistent with known pancreatic carcinoma. Probable mild peripancreatic lymphadenopathy.   Electronically Signed   By: Earle Gell M.D.   On: 10/04/2013 18:54    History of Present Illness: The patient is an 80 year old man with a diagnosis of pancreatic adenocarcinoma in Mar 2015 who had a biliary stent placed in 4/15, subsequently had an aborted Whipple Procedure done due to evidence of hepatic mets, and yesterday had exchange of the biliary stent and removal of a percutaneous cholecystotomy tube done at [facility]. He had decreased appetite yesterday, and today had mild nausea with decreased appetite, global weakness with difficulty walking, and fever. He was advised to come to the ER for evaluation. He has not had sinus congestion, headache, cough, significant abdominal pain, dysuria or frequency. He has had mild constipation, no diarrhea despite pre-stent Flagy treatment. He and his wife request a DNR order.   Hospital Course: Romilda Joy was admitted to a medical bed. Ultrasound showed no acute findings to explain fever as above. He was placed empirically on Rocephin and Flagyl IV to cover GI sources of fever in the setting of his recent biliary stent exchange and removal of percutaneous cholecystectomy tube. With these treatments, his fever defervesced and he reported improvement in his lethargy and  overwhelming fatigue. His liver function tests and white blood count remained normal throughout the hospitalization. He never had any abdominal pain. Given resolution of his fever the patient is stable for discharge home.  We did review the patient's clinical course with his pancreatic cancer and he confirms that he does not desire any aggressive treatments. He does continue to desire palliative management including antibiotics and stent replacement if needed. He and his wife desire hospice care at home which I think is very appropriate given his continued weight loss, functional decline, and poor prognosis with his metastatic pancreatic cancer. Arrangements are being made for home hospice following his discharge today.  Day of Discharge Exam BP 131/62  Pulse 83  Temp(Src) 97.9 F (36.6 C) (Oral)  Resp 16  Ht 5\' 10"  (1.778 m)  Wt 80 kg (176 lb 5.9 oz)  BMI 25.31 kg/m2  SpO2 95%  Physical Exam: General appearance: Apparent weight loss but no acute distress Eyes: no scleral icterus Throat: oropharynx moist without erythema Resp: clear to auscultation bilaterally Cardio: regular rate and rhythm GI: soft, non-tender; bowel sounds normal; no masses,  no organomegaly Extremities: no clubbing, cyanosis or edema  Discharge Labs:  Recent Labs  10/05/13 0335 10/06/13 0333  NA 136* 138  K 4.4 4.2  CL 101 103  CO2 25 24  GLUCOSE 154* 105*  BUN 7 10  CREATININE 0.44* 0.45*  CALCIUM 9.1 8.6    Recent Labs  10/05/13 0335 10/06/13 0333  AST 29 26  ALT 18 16  ALKPHOS 65 57  BILITOT 0.6 0.4  PROT 6.4 5.6*  ALBUMIN 2.8* 2.5*    Recent Labs  10/04/13 1611 10/05/13 0335 10/06/13 0333  WBC 6.1 4.1 9.5  NEUTROABS 4.2  --   --   HGB 13.8 13.4 12.6*  HCT 40.1 39.5 37.4*  MCV 90.3 91.0 91.2  PLT 169 155 166   No results found for this basename: INR, PROTIME   No results found for this basename: CKTOTAL, CKMB, CKMBINDEX, TROPONINI,  in the last 72 hours No results found for  this basename: TSH, T4TOTAL, FREET3, T3FREE, THYROIDAB,  in the last 72 hours No results found for this basename: VITAMINB12, FOLATE, FERRITIN, TIBC, IRON, RETICCTPCT,  in the last 72 hours  Blood and urine cultures are negative to date  Discharge instructions:     Discharge Instructions   Ambulatory referral to Hospice    Complete by:  As directed   Should already be arranged but verify     Diet general    Complete by:  As directed      Discharge instructions    Complete by:  As directed   Call if fever above 101.5, increased abdominal pain.  Drink Ensure twice a day.  Use walker to prevent falls.     Increase activity slowly    Complete by:  As directed            Disposition: To home  Follow-up Appts: Follow-up with Dr. Brigitte Pulse at [facility] in 1 week. Followup at [facility] as scheduled  Condition on Discharge: Stable/improved  Tests Needing Follow-up:  Blood cultures pending  Time with discharge activities: 45 minutes  Signed: Marton Redwood 10/06/2013, 9:50 AM

## 2013-10-06 NOTE — ED Provider Notes (Signed)
Medical screening examination/treatment/procedure(s) were performed by non-physician practitioner and as supervising physician I was immediately available for consultation/collaboration.   EKG Interpretation   Date/Time:  Wednesday Oct 04 2013 15:54:28 EDT Ventricular Rate:  91 PR Interval:  174 QRS Duration: 125 QT Interval:  351 QTC Calculation: 432 R Axis:   -63 Text Interpretation:  Sinus rhythm Nonspecific IVCD with LAD Abnormal T,  consider ischemia, lateral leads No prior for comparison Confirmed by  Dina Rich  MD, Kaysen Deal ([id number]) on 10/04/2013 4:02:44 PM       Merryl Hacker, MD 10/06/13 2045

## 2013-10-06 NOTE — Progress Notes (Signed)
Pt. Has been discharged to home. Pt. Was given his discharge instructions, prescriptions, and all questions were answered.  Pt. Is to be transported home by his wife.

## 2013-10-06 NOTE — Care Management Note (Signed)
    Page 1 of 1   10/06/2013     11:17:55 AM CARE MANAGEMENT NOTE 10/06/2013  Patient:  Seth King, Seth King   Account Number:  [id number]  Date Initiated:  10/06/2013  Documentation initiated by:  Sunday Spillers  Subjective/Objective Assessment:   80 yo male admitted with fever. PTA lived at home with spouse.     Action/Plan:   Home when stable   Anticipated DC Date:  10/06/2013   Anticipated DC Plan:  [place]  CM consult      [facility] Choice  Resumption Of Svcs/PTA Provider   Choice offered to / List presented to:             Status of service:  Completed, signed off Medicare Important Message given?  NA - LOS <3 / Initial given by admissions (If response is "NO", the following Medicare IM given date fields will be blank) Date Medicare IM given:   Date Additional Medicare IM given:    Discharge Disposition:  [place]  Per UR Regulation:  Reviewed for med. necessity/level of care/duration of stay  If discussed at [place] of Stay Meetings, dates discussed:    Comments:  10-06-13 [place] [address] Spoke with [place] with [facility]. She states that referral was made on OP basis but patient was hospitalized prior to assessment. She will be by to see patient prior to d/c to complete process. No needs for equipment identified during hospital stay. No new orders.

## 2013-10-10 LAB — CULTURE, BLOOD (ROUTINE X 2): CULTURE: NO GROWTH

## 2013-10-12 LAB — CULTURE, BLOOD (ROUTINE X 2)

## 2014-02-08 DEATH — deceased

## 2014-10-01 IMAGING — CR DG CHEST 2V
2 series · 2 of 2 positions shown · non-contrast
Comparison: 04/21/2006

CLINICAL DATA: Fever and chills.

EXAM:
CHEST  2 VIEW

[w chest pa]
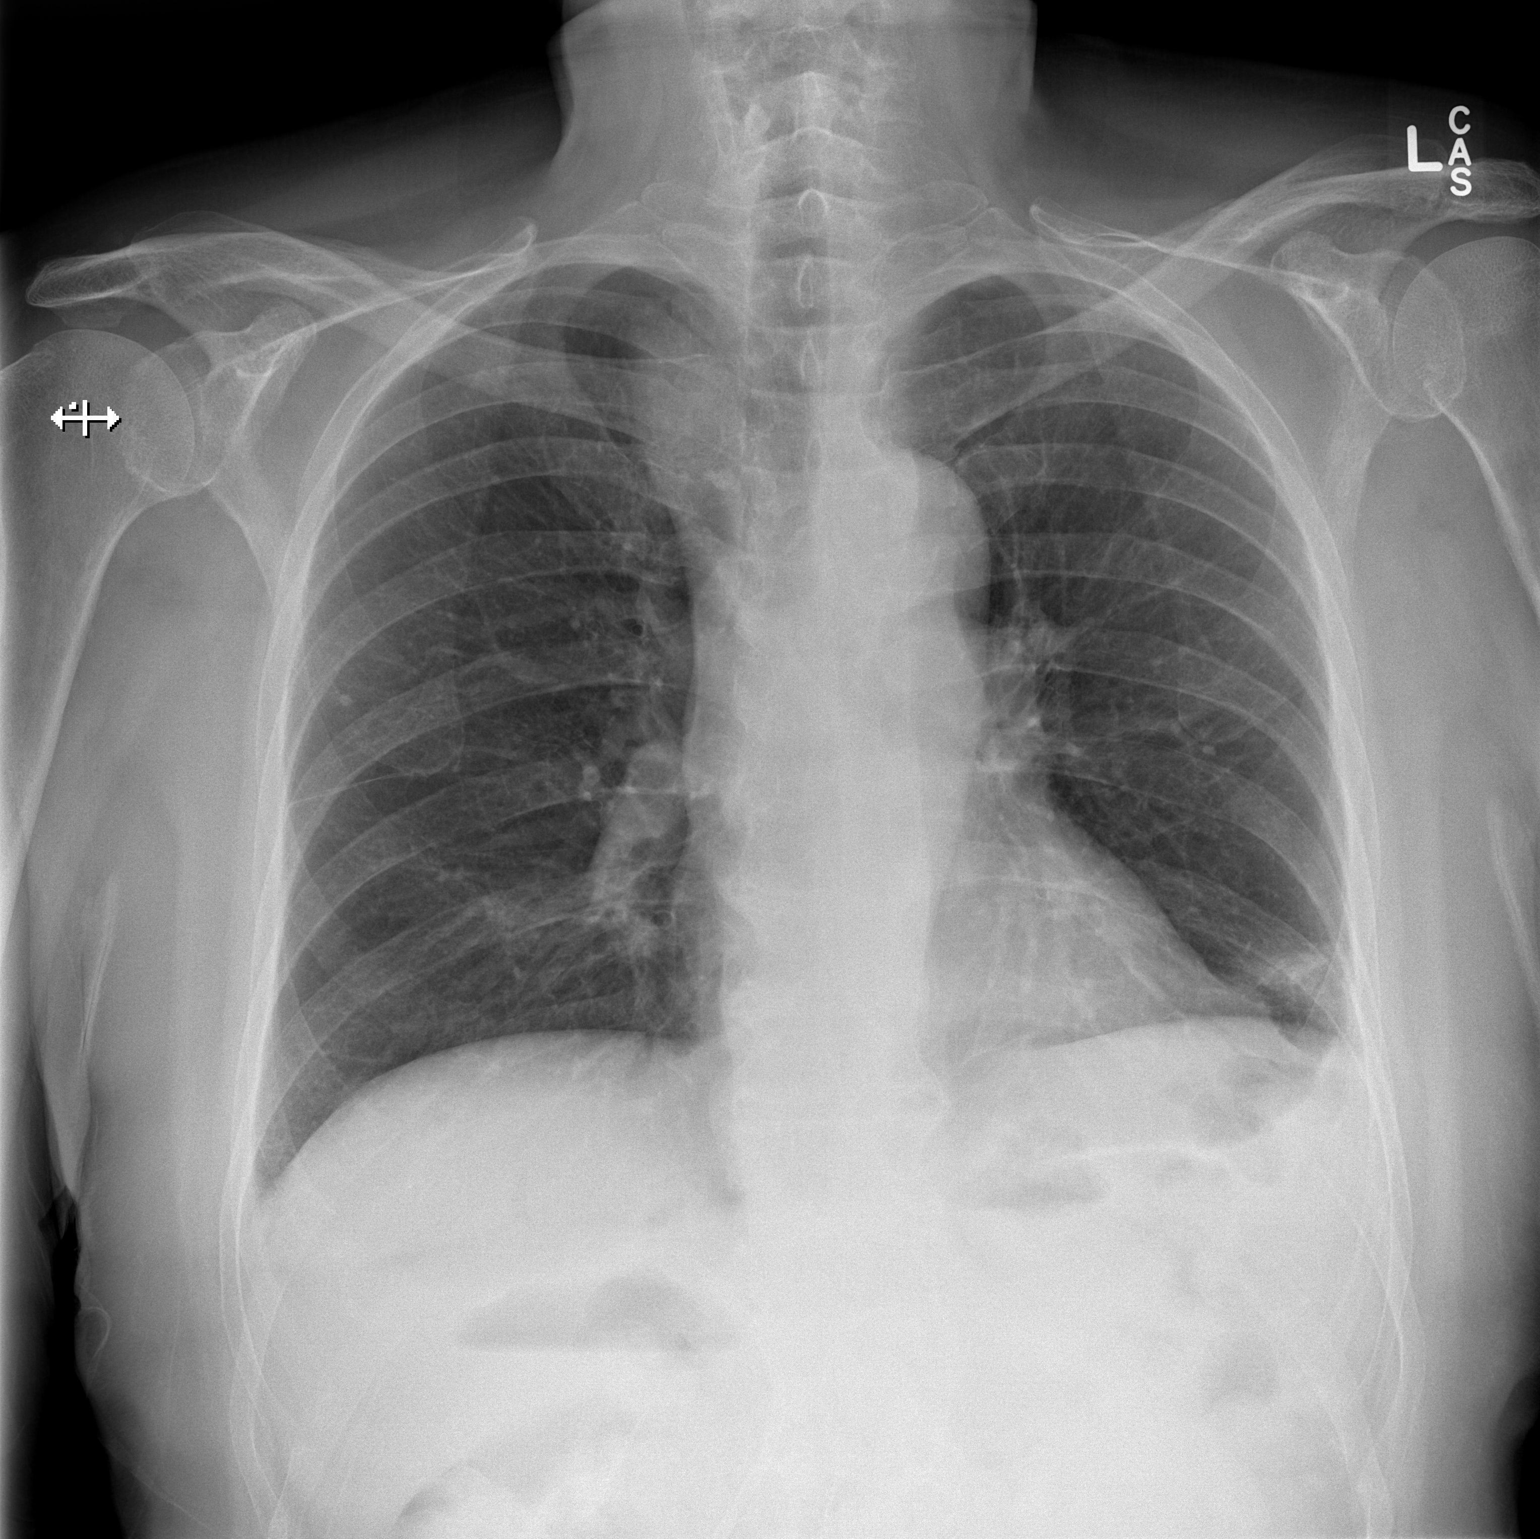

[w chest lat]
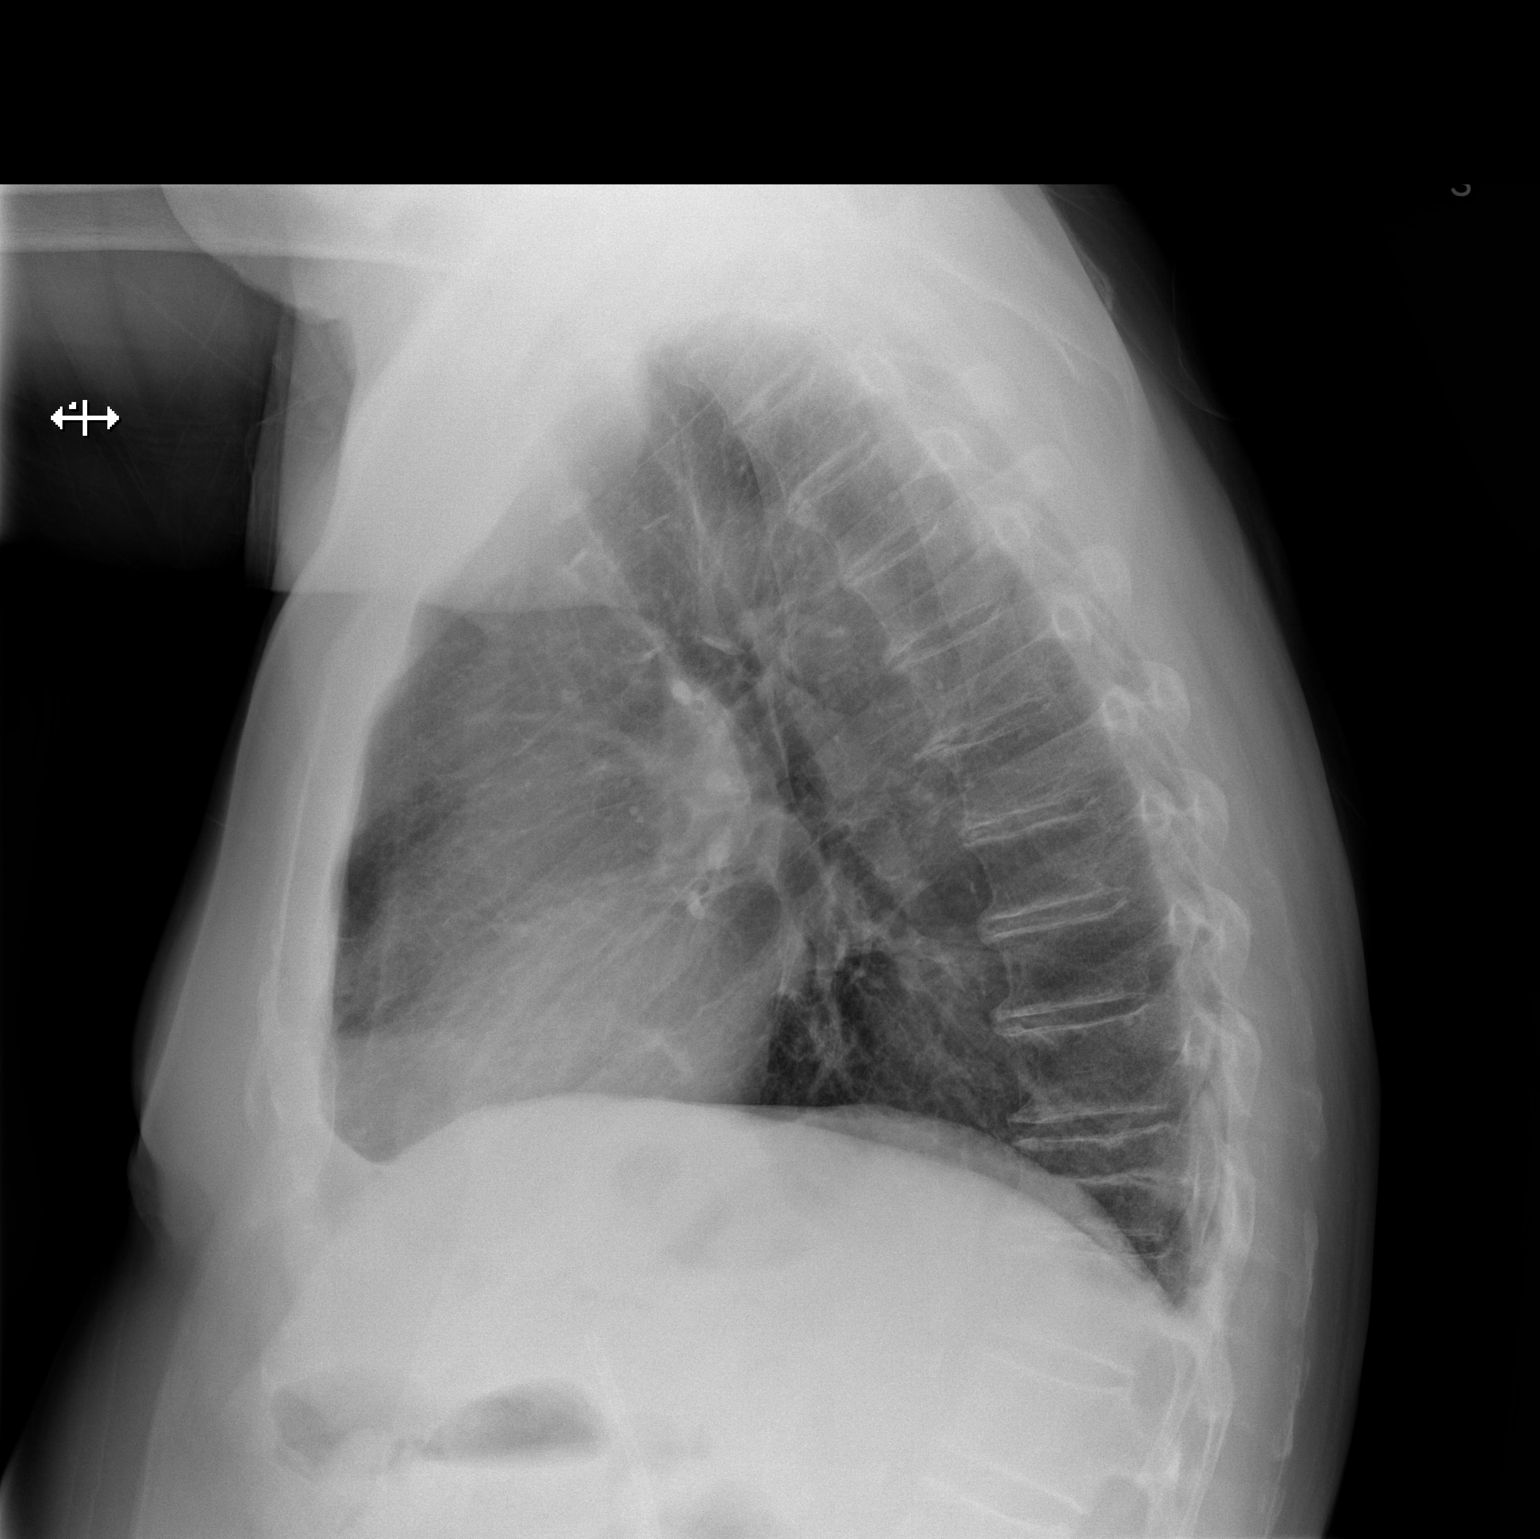

[2 of 2 positions shown; findings below may reference images not displayed]

FINDINGS: The heart size and pulmonary vascularity are normal. There is
minimal scarring at the left base laterally and there is a tiny
calcified granuloma in the right midzone laterally. There are
tortuous brachiocephalic vessels in the superior mediastinum on the
right, unchanged. No acute osseous abnormality.
IMPRESSION: No acute disease.

## 2014-11-25 IMAGING — CR DG CHEST 2V
2 series · 2 of 2 positions shown · non-contrast
Comparison: August 10, 2013

CLINICAL DATA: Shortness of Breath

EXAM:
CHEST  2 VIEW

[w chest lat]
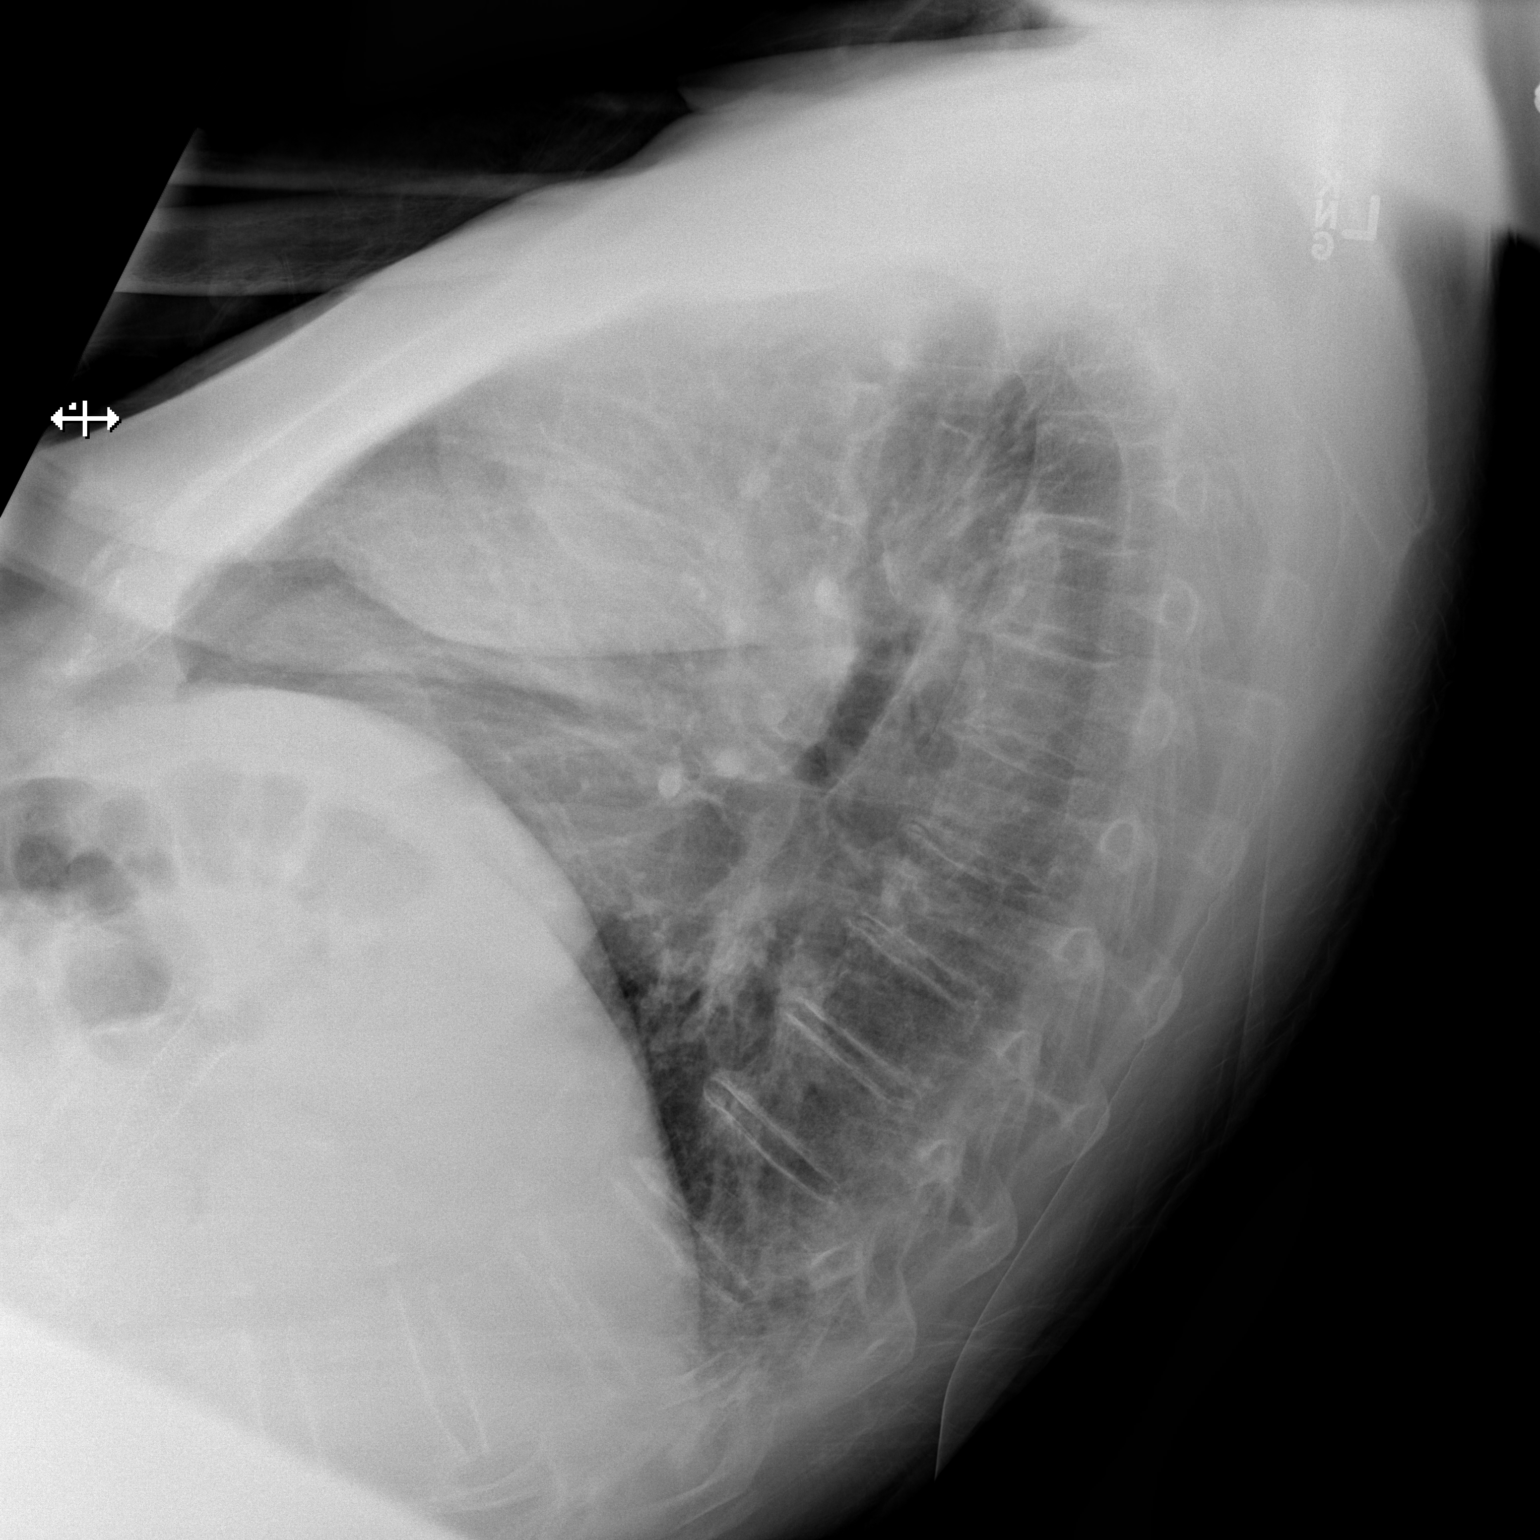

[x chest ap]
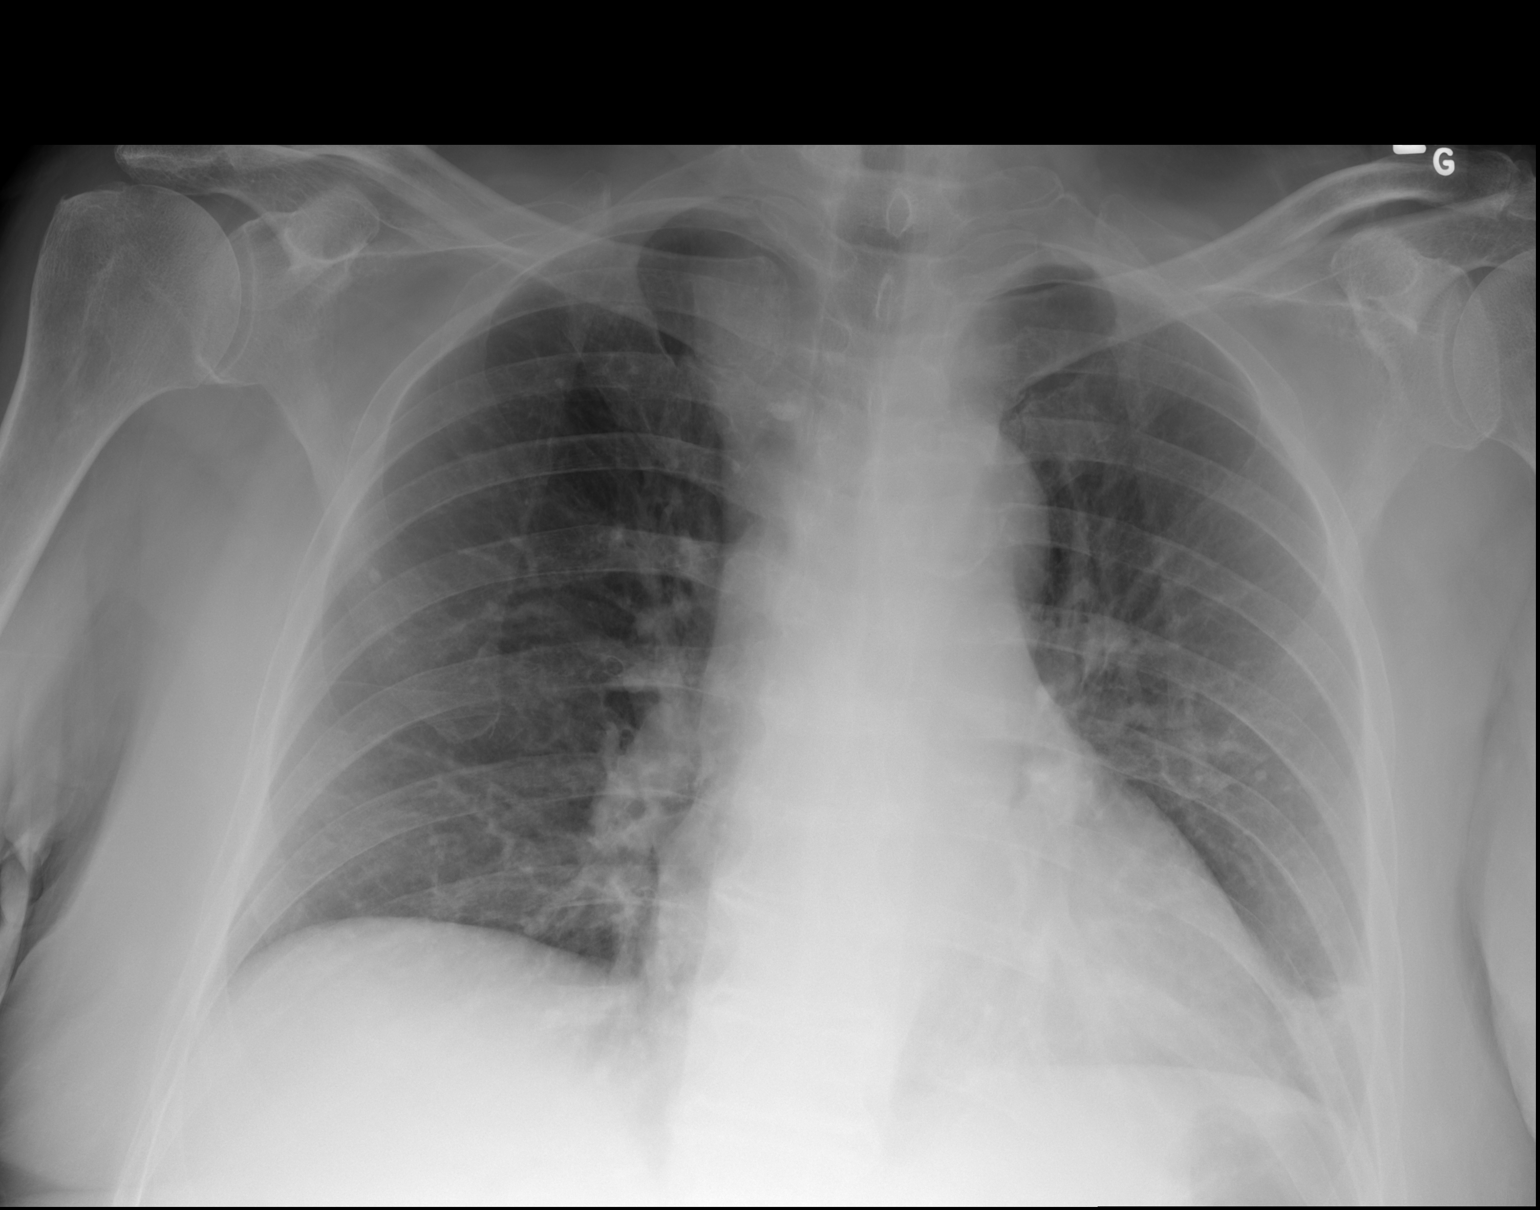

[2 of 2 positions shown; findings below may reference images not displayed]

FINDINGS: There is scarring in the left base. There is a small stable
granuloma in the right mid lung. Elsewhere lungs are clear. Heart is
upper normal in size with normal pulmonary vascularity. No
adenopathy. There is degenerative change in the thoracic spine.
There are no blastic or lytic bone lesions.
IMPRESSION: Scarring left base.  No edema or consolidation.  No adenopathy.

## 2014-11-25 IMAGING — US US ABDOMEN COMPLETE
1 series · 13 of 25 positions shown · non-contrast
Comparison: None.

CLINICAL DATA: Pancreatic carcinoma. Fever. Status post replacement
of biliary stent yesterday.

EXAM:
ULTRASOUND ABDOMEN COMPLETE

[Series 1: us abdomen complete · 0.24mm/px · 13 of 75 slices shown]
[im 1/75]
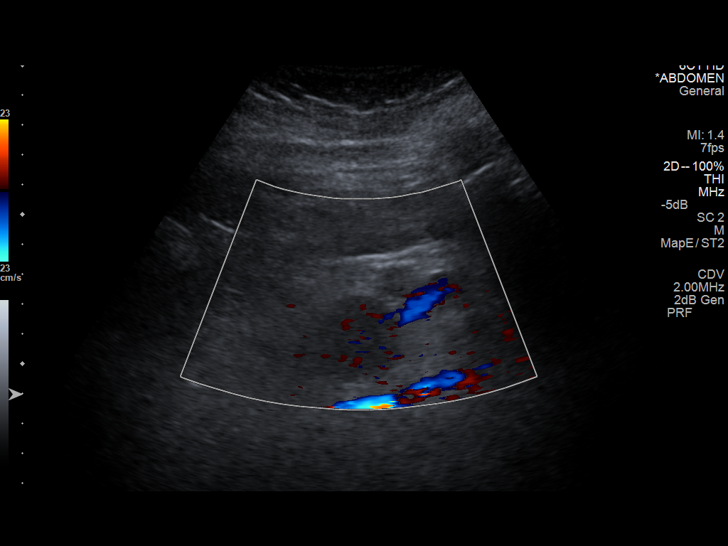
[im 7/75]
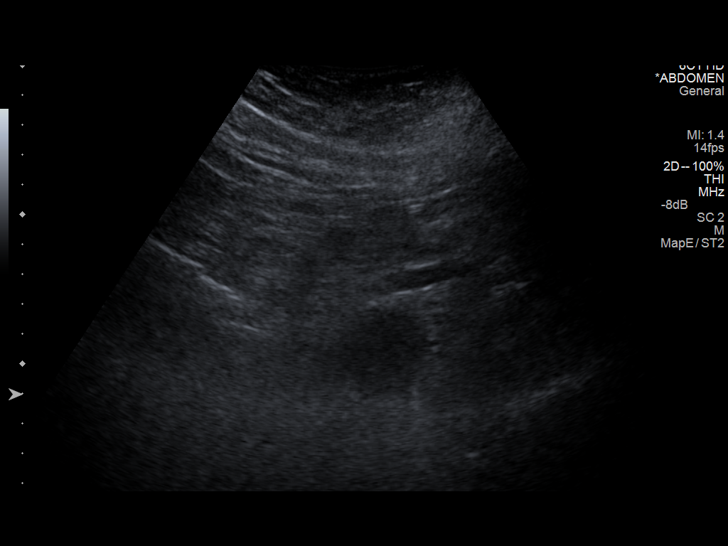
[im 13/75]
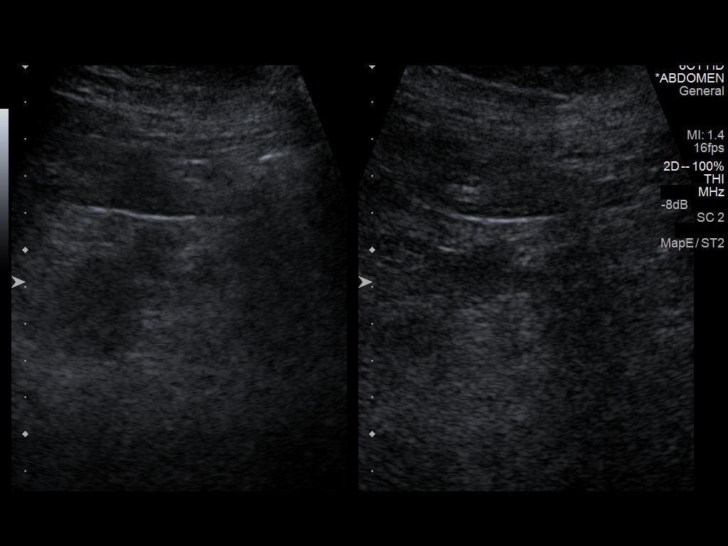
[im 19/75]
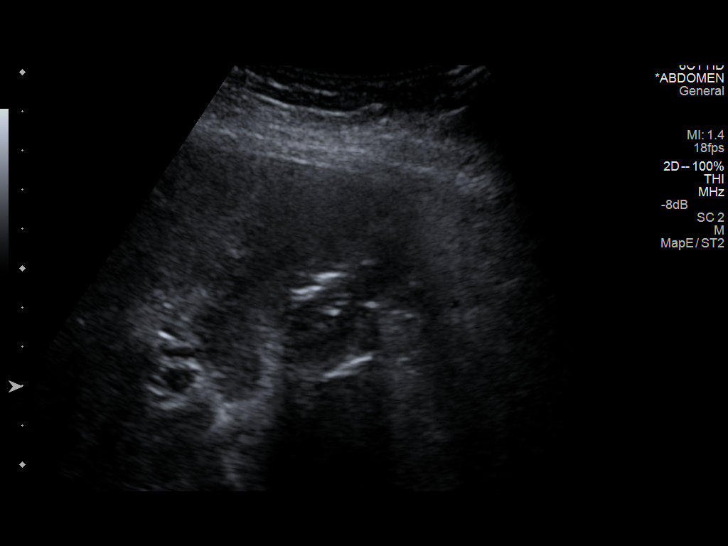
[im 25/75]
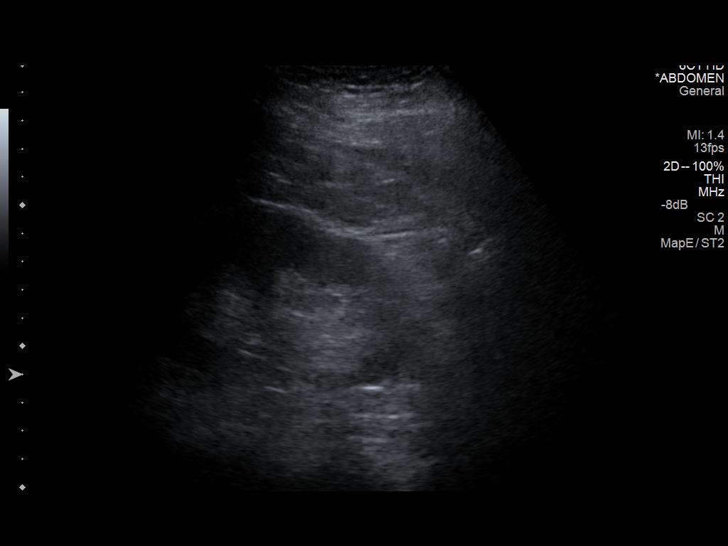
[im 31/75]
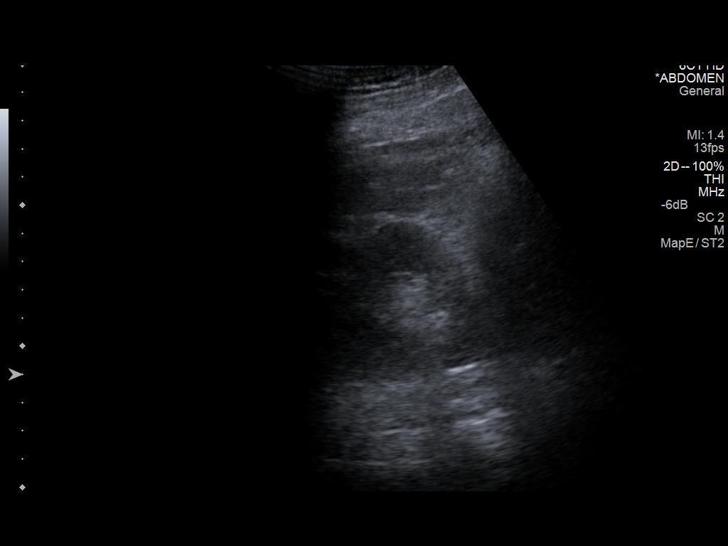
[im 38/75]
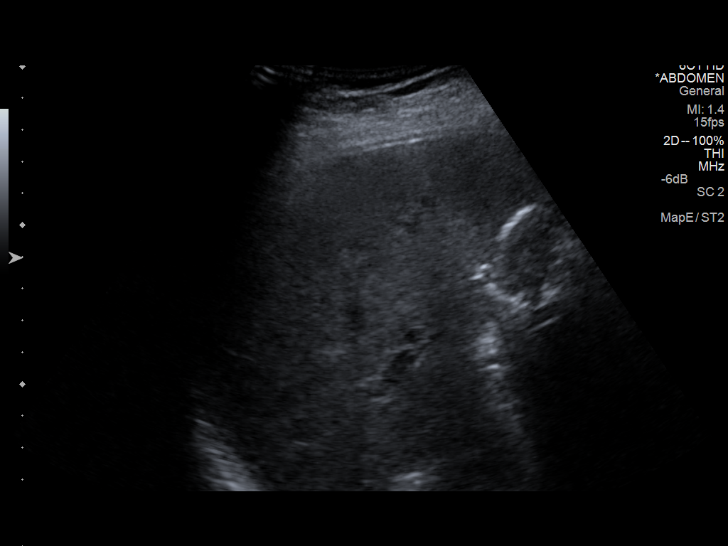
[im 44/75]
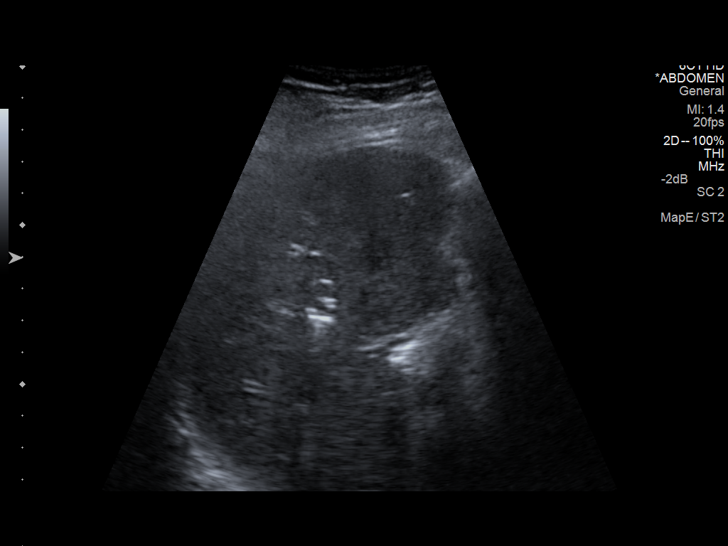
[im 50/75]
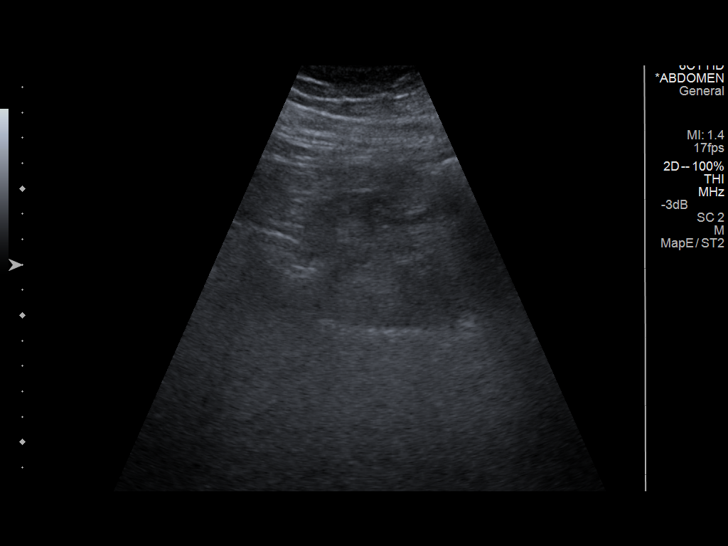
[im 56/75]
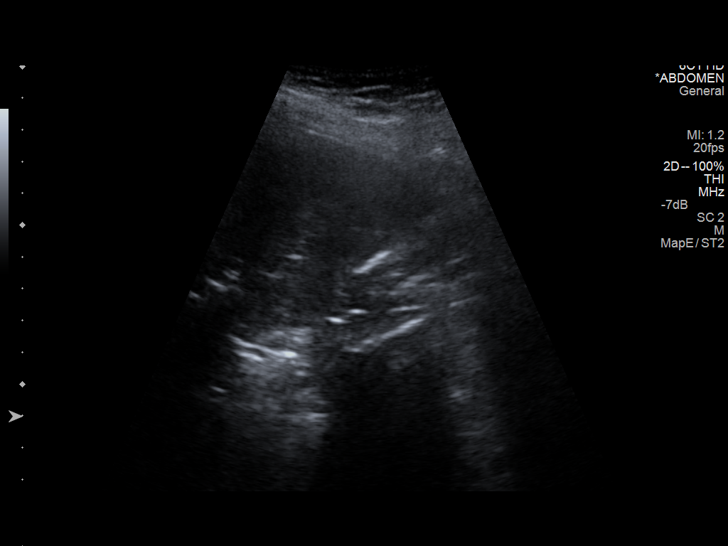
[im 62/75]
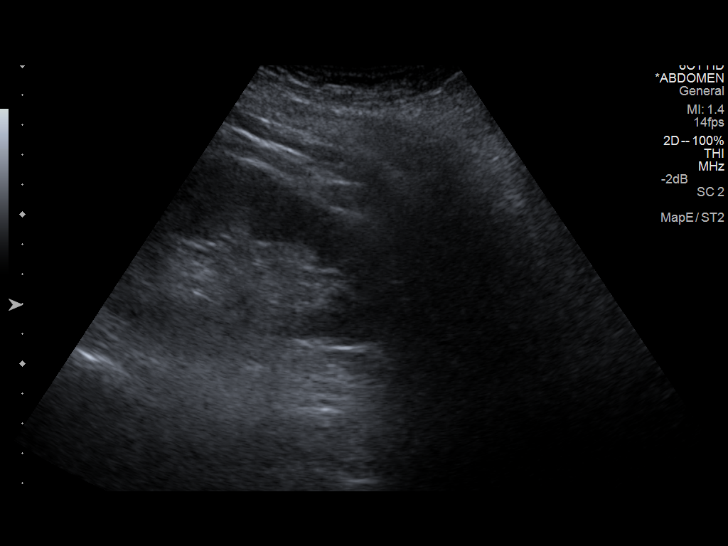
[im 68/75]
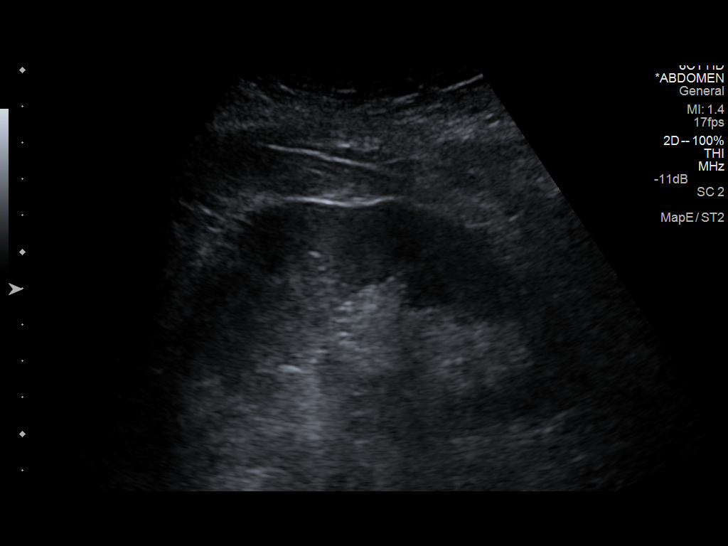
[im 75/75]
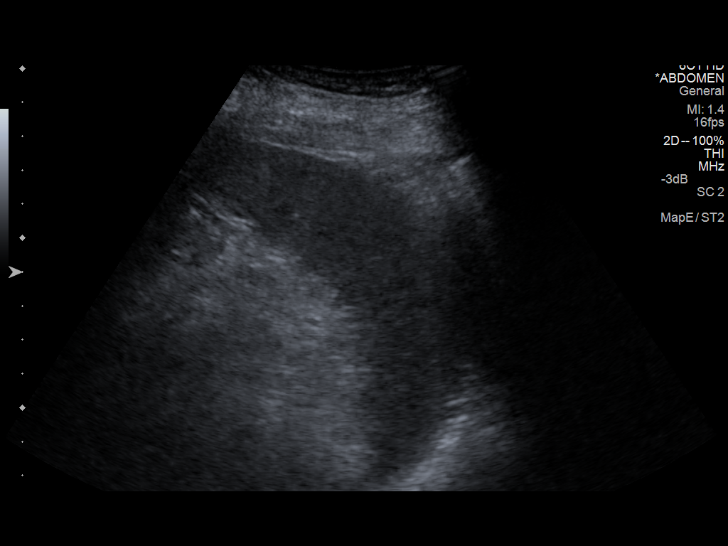

[13 of 25 positions shown; findings below may reference images not displayed]

FINDINGS: Gallbladder:

Percutaneous cholecystostomy tube seen in the gallbladder fossa.
Gallbladder is not well visualized or evaluated on this exam.

Common bile duct:

Diameter: Not well visualized some gas noted intrahepatic bile
ducts, consistent with sphincterotomy and stent placement.

Liver:

No focal lesion identified. Within normal limits in parenchymal
echogenicity.

IVC:

No abnormality visualized.

Pancreas:

A hypoechoic masslike area seen in the region of the pancreatic head
measuring approximately 3 cm, likely due representing the known
pancreatic carcinoma. Other smaller 1-2 cm hypoechoic densities in
the region of the pancreatic neck and body are suspicious for
peripancreatic lymphadenopathy.

Spleen:

Size and appearance within normal limits.

Right Kidney:

Length: 13.0 cm. Echogenicity within normal limits. No mass or
hydronephrosis visualized.

Left Kidney:

Length: 13.6 cm. Echogenicity within normal limits. No mass or
hydronephrosis visualized. Simple cyst noted in upper pole measuring
3.5 cm.

Abdominal aorta:

No aneurysm visualized.

Other findings:

None.
IMPRESSION: Poor visualization of gallbladder due to percutaneous
cholecystostomy tube.

Nonvisualization of common bile duct. Gas noted intrahepatic bile
ducts, consistent with prior sphincterotomy and stent placement.

3 cm hypoechoic mass in region of pancreatic head consistent with
known pancreatic carcinoma. Probable mild peripancreatic
lymphadenopathy.
